# Patient Record
Sex: Female | Born: 1975
Health system: Southern US, Community
[De-identification: ages and names within clinical notes are randomized; demographics above are authoritative.]

## PROBLEM LIST (undated history)

## (undated) DIAGNOSIS — D696 Thrombocytopenia, unspecified: Secondary | ICD-10-CM

## (undated) DIAGNOSIS — R945 Abnormal results of liver function studies: Secondary | ICD-10-CM

## (undated) DIAGNOSIS — R7989 Other specified abnormal findings of blood chemistry: Secondary | ICD-10-CM

## (undated) DIAGNOSIS — F411 Generalized anxiety disorder: Secondary | ICD-10-CM

## (undated) HISTORY — DX: Generalized anxiety disorder: F41.1

## (undated) HISTORY — DX: Other specified abnormal findings of blood chemistry: R79.89

## (undated) HISTORY — DX: Abnormal results of liver function studies: R94.5

## (undated) HISTORY — DX: Thrombocytopenia, unspecified: D69.6

---

## 2003-02-14 ENCOUNTER — Other Ambulatory Visit: Admission: RE | Admit: 2003-02-14 | Discharge: 2003-02-14 | Payer: Self-pay | Admitting: Obstetrics & Gynecology

## 2004-06-13 ENCOUNTER — Ambulatory Visit (HOSPITAL_COMMUNITY): Admission: RE | Admit: 2004-06-13 | Discharge: 2004-06-13 | Payer: Self-pay | Admitting: Obstetrics & Gynecology

## 2006-07-28 ENCOUNTER — Encounter: Payer: Self-pay | Admitting: *Deleted

## 2006-07-28 ENCOUNTER — Inpatient Hospital Stay (HOSPITAL_COMMUNITY): Admission: AD | Admit: 2006-07-28 | Discharge: 2006-07-28 | Payer: Self-pay | Admitting: Family Medicine

## 2006-09-10 ENCOUNTER — Inpatient Hospital Stay (HOSPITAL_COMMUNITY): Admission: AD | Admit: 2006-09-10 | Discharge: 2006-09-10 | Payer: Self-pay | Admitting: *Deleted

## 2006-10-04 ENCOUNTER — Inpatient Hospital Stay (HOSPITAL_COMMUNITY): Admission: AD | Admit: 2006-10-04 | Discharge: 2006-10-09 | Payer: Self-pay | Admitting: Obstetrics and Gynecology

## 2006-10-06 ENCOUNTER — Encounter (INDEPENDENT_AMBULATORY_CARE_PROVIDER_SITE_OTHER): Payer: Self-pay | Admitting: Specialist

## 2009-12-16 ENCOUNTER — Emergency Department (HOSPITAL_COMMUNITY): Admission: EM | Admit: 2009-12-16 | Discharge: 2009-12-16 | Payer: Self-pay | Admitting: Emergency Medicine

## 2010-12-26 NOTE — Op Note (Signed)
NAME:  Lauren Olsen, TURNER         ACCOUNT NO.:  [id number]   MEDICAL RECORD NO.:  [id number]          PATIENT TYPE:  INP   LOCATION:  9165                          [facility]ACILITY:  [facility]   PHYSICIAN:  Creola B. Earlene Plater, M.D.  DATE OF BIRTH:  12-Jun-1976   DATE OF PROCEDURE:  10/06/2006  DATE OF DISCHARGE:                               OPERATIVE REPORT   PREOPERATIVE DIAGNOSES:  1. A 36-week intrauterine pregnancy.  2. Gestational hypertension.  3. Nonreassuring fetal heart rate tracing.   POSTOPERATIVE DIAGNOSES:  1. A 36-week intrauterine pregnancy.  2. Gestational hypertension.  3. Nonreassuring fetal heart rate tracing.   PROCEDURE:  Primary low transverse cesarean section.   SURGEON:  Chester Holstein. Earlene Plater, M.D.   ASSISTANT:  Marlinda Mike, C.N.M.   ANESTHESIA:  Epidural.   SPECIMENS:  Placenta to pathology.   BLOOD LOSS:  800 mL.   COMPLICATIONS:  None.   FINDINGS:  A viable female, cord around the neck x1, Apgars 4 and 6, pH  7.09, weight 5 pounds 15.6 ounces.   HISTORY OF PRESENT ILLNESS:  A 35 year old white female gravida 1, para  0 at 36+ weeks.  She had been admitted for induction of labor.  She had  been followed in the office with mildly elevated blood pressures at the  140s/90s range.  However, on the day of admission, she was noted to have  multiple blood pressures in the office in the 160s/100s to 110 range  with trace protein on dipstick.  A 24-hour urine was being dropped off  at that time as were PIH labs.  She also had complaints of mild  headache, no abdominal pain or visual changes.  Given the sudden  increase in blood pressure with associated headache this close to term,  I recommended to proceed with induction of labor.  She was advised of  the small risk of prematurity which should not be severe given the late  gestational age.  Benefit of induction would be given the sudden  increase in blood pressure, she would be at increased risk of maternal  or fetal  complications such as stroke or abruption.   Patient was induced with Pitocin and progressed to complete and was  pushing when she developed repetitive severe variable decelerations into  the 60-70 range for the fetal heart rate.  It was recovering but in a  prolonged manner.  Examination under epidural in the delivery room  showed the cervix to be completely dilated, +1 to +2 station for the  vertex, however, the position was ambiguous.  It was felt to be  asynclitic and occiput posterior, however, the exact position could not  be determined due to the amount of caput.  Given this, I recommended  stat C. section.   Patient was taken to the operating room and when she arrived, heart rate  was in the 160s,  Prepped and draped in standard fashion.  Foley  catheter was in the bladder.   PROCEDURE:  After prep and drape, Pfannenstiel incision made, carried to  the fascia.  The fascia was divided sharply and elevated with Kocher  clamps.  The inferior  rectus muscles dissected off sharply.  Posterior  sheath and peritoneum elevated and entered sharply.  Bladder blade  inserted.  Bladder flap created sharply.   Uterine incision made in low transverse fashion with the knife.  Clear  fluid at amniotomy, continued laterally and bluntly.  Vertex was found  to be OP, asynclitic with the brow presenting mostly.  An assistants  hand from below was necessary to facility elevation of the vertex.  After this, the mushroom cup vacuum was placed on fetal vertex.  From  the high green zone two pulls were necessary, one pop-off encountered.  Nose and mouth suctioned with the bulb without difficulty.  Cord around  the neck x1 was noted.  Cord was clamped and cut.  Infant was handed off  to the awaiting pediatricians.  Ancef 1 g was given at cord clamp.  Cord  pH obtained.   Placenta removed manually.  Uterus left in situ, cleared of all clots  and debris.  Slight uterine extension on the right hand side  which was  fully visualized and found to be well away from the broad ligament or  bladder.  Uterine incision closed in a running locked fashion with 0  chromic, second imbricating layer placed with hemostasis obtained.   Pelvis irrigated.  Uterine incision and bladder flap were hemostatic.   Fascia was closed with a running stitch of 0 Vicryl.  Subcutaneous  tissue was irrigated and made hemostatic with the Bovie and  reapproximated with a stitch of 2-0 plain suture.  Skin was closed with  staples.   The patient tolerated the procedure well.  There were no complications.  She was taken to the recovery room awake, alert and in stable condition.  Her baby boy was taken to the NICU for observation given mild increased  work of breathing.  Findings of the cord around the neck discussed with  the patient just after surgery as well as the pH and Apgar results.      [name] B. Earlene Plater, M.D.  Electronically Signed     WBD/MEDQ  D:  10/06/2006  T:  10/06/2006  Job:  [id number]

## 2010-12-26 NOTE — Discharge Summary (Signed)
NAME:  KARRY, BARRILLEAUX         ACCOUNT NO.:  [id number]   MEDICAL RECORD NO.:  [id number]          PATIENT TYPE:  INP   LOCATION:  9304                          [facility]:  [facility]   PHYSICIAN:  [place] B. Earlene Plater, M.D.  DATE OF BIRTH:  [birth year]   DATE OF ADMISSION:  10/04/2006  DATE OF DISCHARGE:                               DISCHARGE SUMMARY   ADMISSION DIAGNOSES:  1. A 36-week intrauterine pregnancy.  2. Hypertension (gestational versus PIH).   DISCHARGE DIAGNOSES:  1. A 36-week intrauterine pregnancy.  2. Hypertension (gestational versus PIH).   PROCEDURES THIS ADMISSION:  1. Induction of labor.  2. Primary low transverse Cesarean section for nonreassuring fetal      heart tracing.   HISTORY OF PRESENT ILLNESS:  A 35 year old white female, gravida 1, para  1, admitted from the office at 36+ weeks with sudden increased blood  pressure. Had been followed as an outpatient with blood pressures in the  140s/90s range with normal 24-hour urine. On the day of admission in the  office, she was found to be in the 160s/100 to 110 range on several  blood pressure checks. Also had trace protein in the urine and  complaints of a headache. Given this abrupt change in blood pressure  with associated headache, I recommended induction of labor.   HOSPITAL COURSE:  The patient was admitted, cervix ripened, and  subsequently Pitocin started. Interestingly, her blood pressures  remained in the mildly elevated range and never again seen as high as in  the office. She was stabilized in the 140s/80s max with normal  preeclampsia labs. Subsequent to her induction being initiated, she was  found to have a normal 24-hour urine from the office lab report.  However, given the previous blood pressure readings and symptoms, I  recommend we proceed with the induction.   The patient was ultimately complete and pushing when she was found to  have repetitive, severe deceleration associated fetal  brachycardia into  the 70s. Given this, we proceeded with a stat cesarean section. In the  OR, the heart rate did recover to the 160s. She was delivered by primary  low transverse Cesarean section a viable female. Nuchal cord x1 noted.  Apgars were 4 and 6, pH was 7.09, weight was 5 pounds 16.5 ounces. Given  her normal urine results on 24-hour urine and blood pressure, she was  not placed on magnesium post partum.   The patient's blood pressures mostly remained in the 140s/80s with an  occasional 160/100 postoperatively. Her platelets did drop into the 90s  on the first postpartum day. One dose of Solu-Medrol 120 mg IV was given  for this. Her platelets oscillated between the 90 and 100 range  throughout the rest of her stay without any further decrease. The  remainder of her labs were normal.   The patient was discharged to home on the third postoperative day in  satisfactory condition. Her newborn boy was in the NICU for mild  increased work of breathing but stable and doing well with anticipation  for discharge tomorrow.   DISPOSITION AT DISCHARGE:  Satisfactory. Follow up in 2  days [facility], staple removal and blood pressure check and recheck of platelet  count.   DISCHARGE INSTRUCTIONS:  Standard preprinted instructions given prior to  dismissal as well as preeclamptic precautions.   DISCHARGE MEDICATIONS:  1. Percocet 1 to 2 p.o. q.4-6h. p.r.n. for pain.  2. Ibuprofen 800 mg every 8 hours p.r.n. for pain.      [name] B. Earlene Plater, M.D.  Electronically Signed     WBD/MEDQ  D:  10/09/2006  T:  10/09/2006  Job:  [id number]

## 2010-12-26 NOTE — Op Note (Signed)
Lauren Olsen, Lauren Olsen            ACCOUNT NO.:  [id number]   MEDICAL RECORD NO.:  [id number]          PATIENT TYPE:  AMB   LOCATION:  SDC                           [facility]   PHYSICIAN:  Genia Del, M.D.DATE OF BIRTH:  1976/02/27   DATE OF PROCEDURE:  DATE OF DISCHARGE:                                 OPERATIVE REPORT   PREOPERATIVE DIAGNOSIS:  CIN 2 with large ectropion.   POSTOPERATIVE DIAGNOSIS:  CIN 2 with large ectropion.   INTERVENTION:  Colposcopy with acetic acid and CO2 laser vaporization of  cervix.   PROCEDURE:  Under general anesthesia with endotracheal intubation and the  patient in the lithotomy position, wet drapes are put in place.  The  carbonated speculum is inserted and colposcopy is done after applying acetic  acid on the cervix.  The ectropion is large in the area of punctation and  acetowhite reaction is present especially around 9 o'clock on the external  aspect of the ectropion; otherwise, mild acetowhite changes are present all  around the cervix but more mildly.  We then used the CO2 laser with a power  of 10 and vaporized the cervix starting at 9 o'clock to 6 o'clock.  At the  area of CIN 2 we got a little bit more deeply to about 5 mm in depth.  The  rest of the cervix is vaporized to a depth less than 3 mm.  The whole  ectropion with a small margin is vaporized.  Bleeding is controlled with the  laser and the completed with Monsel.  Hemostasis is adequate.  Estimated  blood loss was less than 50 mL.  No complications occurred and the patient  was brought to recovery room in good status.  The patient will be seen 3  weeks postoperatively and a repeat Pap test will be done in 3-4 months.      ML/MEDQ  D:  06/13/2004  T:  06/14/2004  Job:  [id number]

## 2014-01-02 ENCOUNTER — Telehealth: Payer: Self-pay | Admitting: Hematology and Oncology

## 2014-01-02 ENCOUNTER — Other Ambulatory Visit: Payer: Self-pay | Admitting: Family Medicine

## 2014-01-02 DIAGNOSIS — R229 Localized swelling, mass and lump, unspecified: Principal | ICD-10-CM

## 2014-01-02 DIAGNOSIS — IMO0002 Reserved for concepts with insufficient information to code with codable children: Secondary | ICD-10-CM

## 2014-01-02 NOTE — Telephone Encounter (Signed)
C/D 01/02/14 for appt. 01/17/14

## 2014-01-02 NOTE — Telephone Encounter (Signed)
S/W PATIENT AND GAVE NP APPT FOR 06/10 @ 10:45 W/DR. Carleton, CNM DX- LOW PLTS  WELCOME PACKET MAILED.

## 2014-01-03 ENCOUNTER — Ambulatory Visit
Admission: RE | Admit: 2014-01-03 | Discharge: 2014-01-03 | Disposition: A | Discharge status: Home / Self Care | Payer: 59 | Source: Ambulatory Visit | Attending: Family Medicine | Admitting: Family Medicine

## 2014-01-03 ENCOUNTER — Other Ambulatory Visit: Payer: Self-pay | Admitting: Family Medicine

## 2014-01-03 DIAGNOSIS — IMO0002 Reserved for concepts with insufficient information to code with codable children: Secondary | ICD-10-CM

## 2014-01-03 DIAGNOSIS — M79622 Pain in left upper arm: Secondary | ICD-10-CM

## 2014-01-03 DIAGNOSIS — R229 Localized swelling, mass and lump, unspecified: Principal | ICD-10-CM

## 2014-01-04 ENCOUNTER — Ambulatory Visit
Admission: RE | Admit: 2014-01-04 | Discharge: 2014-01-04 | Disposition: A | Discharge status: Home / Self Care | Payer: 59 | Source: Ambulatory Visit | Attending: Family Medicine | Admitting: Family Medicine

## 2014-01-04 DIAGNOSIS — M79622 Pain in left upper arm: Secondary | ICD-10-CM

## 2014-01-10 ENCOUNTER — Telehealth: Payer: Self-pay | Admitting: Hematology and Oncology

## 2014-01-10 NOTE — Telephone Encounter (Signed)
pt lmonvm to cx 6/10 appt and she does not wish to r/s. new pt - message to NG and tiffany.

## 2014-01-17 ENCOUNTER — Ambulatory Visit: Payer: Self-pay

## 2014-01-17 ENCOUNTER — Ambulatory Visit: Payer: Self-pay | Admitting: Hematology and Oncology

## 2016-05-06 LAB — HM PAP SMEAR

## 2017-05-17 ENCOUNTER — Telehealth: Payer: Self-pay | Admitting: Oncology

## 2017-05-17 ENCOUNTER — Encounter: Payer: Self-pay | Admitting: Oncology

## 2017-05-17 NOTE — Telephone Encounter (Signed)
Appt has been scheduled for the pt to see Dr. Alen Blew on 10/10 at 2pm. Pt aware to arrive 30 minutes early. Address and insurance updated. Letter faxed to the referring.

## 2017-05-19 ENCOUNTER — Ambulatory Visit (HOSPITAL_BASED_OUTPATIENT_CLINIC_OR_DEPARTMENT_OTHER): Payer: 59 | Admitting: Oncology

## 2017-05-19 ENCOUNTER — Ambulatory Visit (HOSPITAL_BASED_OUTPATIENT_CLINIC_OR_DEPARTMENT_OTHER): Payer: 59

## 2017-05-19 ENCOUNTER — Telehealth: Payer: Self-pay | Admitting: Oncology

## 2017-05-19 VITALS — BP 160/53 | HR 91 | Temp 98.6°F | Resp 18 | Ht 69.0 in | Wt 299.6 lb

## 2017-05-19 DIAGNOSIS — D696 Thrombocytopenia, unspecified: Secondary | ICD-10-CM | POA: Diagnosis not present

## 2017-05-19 LAB — COMPREHENSIVE METABOLIC PANEL
ALK PHOS: 77 U/L (ref 40–150)
ALT: 46 U/L (ref 0–55)
ANION GAP: 8 meq/L (ref 3–11)
AST: 74 U/L — ABNORMAL HIGH (ref 5–34)
Albumin: 3.7 g/dL (ref 3.5–5.0)
BILIRUBIN TOTAL: 1.02 mg/dL (ref 0.20–1.20)
BUN: 13 mg/dL (ref 7.0–26.0)
CO2: 23 mEq/L (ref 22–29)
Calcium: 9.3 mg/dL (ref 8.4–10.4)
Chloride: 108 mEq/L (ref 98–109)
Creatinine: 0.7 mg/dL (ref 0.6–1.1)
GLUCOSE: 89 mg/dL (ref 70–140)
Potassium: 3.9 mEq/L (ref 3.5–5.1)
Sodium: 138 mEq/L (ref 136–145)
Total Protein: 7.7 g/dL (ref 6.4–8.3)

## 2017-05-19 LAB — CBC WITH DIFFERENTIAL/PLATELET
BASO%: 0.3 % (ref 0.0–2.0)
Basophils Absolute: 0 10*3/uL (ref 0.0–0.1)
EOS ABS: 0.1 10*3/uL (ref 0.0–0.5)
EOS%: 1.7 % (ref 0.0–7.0)
HEMATOCRIT: 41.3 % (ref 34.8–46.6)
HGB: 14.2 g/dL (ref 11.6–15.9)
LYMPH#: 0.5 10*3/uL — AB (ref 0.9–3.3)
LYMPH%: 15 % (ref 14.0–49.7)
MCH: 30.7 pg (ref 25.1–34.0)
MCHC: 34.4 g/dL (ref 31.5–36.0)
MCV: 89.2 fL (ref 79.5–101.0)
MONO#: 0.3 10*3/uL (ref 0.1–0.9)
MONO%: 9.1 % (ref 0.0–14.0)
NEUT%: 73.9 % (ref 38.4–76.8)
NEUTROS ABS: 2.7 10*3/uL (ref 1.5–6.5)
Platelets: 54 10*3/uL — ABNORMAL LOW (ref 145–400)
RBC: 4.63 10*6/uL (ref 3.70–5.45)
RDW: 14 % (ref 11.2–14.5)
WBC: 3.6 10*3/uL — AB (ref 3.9–10.3)

## 2017-05-19 LAB — CHCC SMEAR

## 2017-05-19 NOTE — Progress Notes (Signed)
Reason for Referral: Thrombocytopenia.   HPI: 41 year old woman native of [place] currently lives in this area. She is a healthy woman without any significant comorbid conditions. She was noted to have thrombocytopenia on a CBC done on 05/11/2017. She had the blood work done as a preoperative assessment before tubal ligation. She noted that to have a thrombocytopenia periodically for the last 10 years after her pregnancy. Her pregnancy was uncomplicated except for preeclampsia and was induced and gave birth without complications. She denied any postoperative bleeding. She did have menorrhagia and was started on oral contraceptives and for last 2 years has had no leading issues. She denies any hematochezia, melena, epistaxis or ecchymosis. She denied any petechiae. Her CBC on 05/11/2017 showed a white count of 3.0, hemoglobin of 14 1 and MCV of 86. Her RDW was 14. Platelet count count of 54. Her creatinine was 0.57, with AST of 53 which was slightly elevated. Her liver function tests otherwise normal. Her total protein, calcium and electrolytes were all normal.  She denied any headaches, blurry vision, syncope or seizures. She does not report any fevers, chills, sweats or weight loss. She does not report any chest pain, palpitation, orthopnea or leg edema. She does not report any cough, wheezing or hemoptysis. She does not report any nausea, vomiting or abdominal pain. She does not report any frequency urgency or hesitancy. She does not report any skeletal complaints. Remaining review of systems unremarkable.   No past medical history for hypertension, hyperlipidemia or coronary disease.    Current Outpatient Prescriptions:  .  CAMILA 0.35 MG tablet, Take 1 tablet by mouth daily., Disp: , Rfl:  .  FLUoxetine (PROZAC) 40 MG capsule, Take 40 mg by mouth daily., Disp: , Rfl: :  No Known Allergies:  No family history significant for blood disorders.   Social History   Social History  . Marital  status: Single    Spouse name: N/A  . Number of children: N/A  . Years of education: N/A   Occupational History  . Not on file.   Social History Main Topics  . Smoking status: Not on file  . Smokeless tobacco: Not on file  . Alcohol use Not on file  . Drug use: Unknown  . Sexual activity: Not on file  She denied any alcohol or tobacco abuse. Other Topics Concern  . Not on file   Social History Narrative  . No narrative on file  :  Pertinent items are noted in HPI.  Exam: Blood pressure (!) 160/53, pulse 91, temperature 98.6 F (37 C), temperature source Oral, resp. rate 18, height 5' 9"  (1.753 m), weight 299 lb 9.6 oz (135.9 kg), SpO2 96 %. General appearance: alert and cooperative appeared without distress. Throat: No oral thrush or ulcers. Neck: no adenopathy or masses. Back: negative Resp: clear to auscultation bilaterally Chest wall: no tenderness Cardio: regular rate and rhythm, S1, S2 normal, no murmur, click, rub or gallop GI: soft, non-tender; bowel sounds normal; no masses,  no splenomegaly. Pulses: 2+ and symmetric Skin: Skin color, texture, turgor normal. No rashes or lesions. No petechiae or ecchymosis. Lymph nodes: Cervical, supraclavicular, and axillary nodes normal.    Assessment and Plan:    41 year old woman with the following issues:  1. Thrombocytopenia: Documented on 05/11/2017 with a platelet count of 54,000. His platelet counts have been abnormal in the last 10 years per her report. She denied any bleeding episodes but does report occasional easy bruising but no ecchymosis. She did have  menorrhagia 2 years ago but have improved with oral contraceptives.  The differential diagnosis of thrombocytopenia was discussed today. Given her age and presentation this appears to be consistent with ITP. Other considerations would include medication related, autoimmune, liver disease among others.  Management standpoint, I will repeat her CBC and a peripheral  smear today for review. As long as her platelet counts remain above 50,000, continued observation may be warranted. She is not at risk of spontaneous bleeding at this time.  We discussed different treatment options if needed to to boost her platelet count. These include corticosteroids and IVIG. These options would be used if her platelet counts drop or anticipating surgical procedure.  2. Bleeding risk: She is scheduled to have a tubal ligation in the near future. Platelet count count of 54,000 should be adequate to have the procedure safely. I will repeat her platelet counts to ensure of that today.  3. Follow-up: Will be in 3 months sooner if needed to.

## 2017-05-19 NOTE — Telephone Encounter (Signed)
Scheduled appt per 10/10 los - Gave patient AVS and calender per los.

## 2017-08-18 ENCOUNTER — Telehealth: Payer: Self-pay | Admitting: Oncology

## 2017-08-18 ENCOUNTER — Other Ambulatory Visit: Payer: Self-pay | Admitting: Oncology

## 2017-08-18 DIAGNOSIS — D696 Thrombocytopenia, unspecified: Secondary | ICD-10-CM

## 2017-08-18 NOTE — Telephone Encounter (Signed)
Patient called to cancel did not want to reschedule at this time

## 2017-08-19 ENCOUNTER — Other Ambulatory Visit: Payer: 59

## 2017-08-19 ENCOUNTER — Ambulatory Visit: Payer: 59 | Admitting: Oncology

## 2018-05-05 NOTE — Progress Notes (Signed)
Lauren Olsen is a 42 y.o. female is here to [employer].   Patient Care Team: Lauren Deutscher, DO as PCP - General (Family Medicine) Lauren Olsen, CNM as Midwife (Obstetrics and Gynecology)   History of Present Illness:   Lauren Olsen, CMA acting as scribe for Dr. Briscoe Olsen.   HPI: Patient in office to establish care. Was referred here by Dr. Buelah Olsen. She has history of low blood counts she would like to have checked. She is also having increased bilateral knee pain.   Within the past 3 months... Rarely Sometimes Often Always  During the past 3 months, have you had any episodes of excessive overeating?  x    Do you feel distressed about your excessive overeating?   x   During your episodes of excessive overeating, how often did you feel like you had no control over your eating?   x   During your episodes of excessive eating, how often did you continue to eat even though you were not hungry?   x   During your episodes of excessive overeating, how often were you embarrassed by how much you ate?   x   During your episodes of excessive overeating, how often did you feel disgusted by yourself or guilty afterwards?    x  During the last 3 months, how often did you make yourself vomit as a means to control your weight or shape? NEVER       Review of Systems  Constitutional: Positive for malaise/fatigue. Negative for chills, fever and weight loss.       Weight gain.  Respiratory: Negative for cough, shortness of breath and wheezing.        Snoring.  Cardiovascular: Negative for chest pain, palpitations and leg swelling.  Gastrointestinal: Negative for abdominal pain, constipation, diarrhea, nausea and vomiting.  Genitourinary: Negative for dysuria and urgency.  Musculoskeletal: Positive for joint pain. Negative for myalgias.  Skin: Negative for rash.  Neurological: Negative for dizziness and headaches.  Psychiatric/Behavioral: Negative for depression, substance abuse  and suicidal ideas. The patient is not nervous/anxious.     Health Maintenance Due  Topic Date Due  . TETANUS/TDAP  09/03/1992  . PAP SMEAR  09/03/1994   Depression screen PHQ 2/9 05/06/2018  Decreased Interest 1  Down, Depressed, Hopeless 1  PHQ - 2 Score 2  Altered sleeping 2  Tired, decreased energy 3  Change in appetite 2  Feeling bad or failure about yourself  1  Trouble concentrating 1  Moving slowly or fidgety/restless 1  Suicidal thoughts 0  PHQ-9 Score 12  Difficult doing work/chores Somewhat difficult   PMHx, SurgHx, SocialHx, Medications, and Allergies were reviewed in the Visit Navigator and updated as appropriate.   Past Medical History:  Diagnosis Date  . Elevated LFTs   . GAD (generalized anxiety disorder)   . Thrombocytopenia ([place])     History reviewed. No pertinent surgical history.   Family History  Problem Relation Age of Onset  . Hearing loss Mother   . Heart disease Mother   . Miscarriages / [place] Mother   . Alcohol abuse Father   . Arthritis Maternal Grandmother   . Heart attack Maternal Grandmother   . Heart disease Maternal Grandmother   . Cancer Paternal Grandfather     Social History   Tobacco Use  . Smoking status: Never Smoker  . Smokeless tobacco: Never Used  Substance Use Topics  . Alcohol use: Yes    Comment: occ   .  Drug use: Never    Current Medications and Allergies:   .  CAMILA 0.35 MG tablet, Take 1 tablet by mouth daily., Disp: , Rfl:  .  FLUoxetine (PROZAC) 40 MG capsule, Take 40 mg by mouth daily., Disp: , Rfl:   No Known Allergies   Review of Systems:   Pertinent items are noted in the HPI. Otherwise, ROS is negative.  Vitals:   Vitals:   05/06/18 1036  BP: 140/78  Pulse: 84  Temp: 99.2 F (37.3 C)  TempSrc: Oral  SpO2: 96%  Weight: (!) 307 lb 6.4 oz (139.4 kg)  Height: 5' 9"  (1.753 m)     Body mass index is 45.4 kg/m.  Physical Exam:   Physical Exam  Constitutional: She appears  well-nourished.  HENT:  Head: Normocephalic and atraumatic.  Eyes: Pupils are equal, round, and reactive to light. EOM are normal.  Neck: Normal range of motion. Neck supple.  Cardiovascular: Normal rate, regular rhythm, normal heart sounds and intact distal pulses.  Pulmonary/Chest: Effort normal.  Abdominal: Soft.  Skin: Skin is warm.  Psychiatric: She has a normal mood and affect. Her behavior is normal.  Nursing note and vitals reviewed.  Assessment and Plan:   Lauren was seen today for establish care.  Diagnoses and all orders for this visit:  Weight gain Comments: Hx of weight loss with phentermine of 75 pounds but gained back. Orders: -     phentermine (ADIPEX-P) 37.5 MG tablet; Take 1 tablet (37.5 mg total) by mouth daily before breakfast.  Encounter for screening for HIV -     HIV Antibody (routine testing w rflx)  Malaise and fatigue -     CBC with Differential/Platelet -     Comprehensive metabolic panel -     Lipid panel -     Vitamin B12 -     TSH  Vitamin D deficiency -     VITAMIN D 25 Hydroxy (Vit-D Deficiency, Fractures)  Sleep-disordered breathing -     Ambulatory referral to Sleep Studies  GAD (generalized anxiety disorder) Comments: Stable on Prozac for 10 years. Okay with changing if needed.  Thrombocytopenia ([place]) Comments: Hx of evaluation by Hematology. Okay to monitor as long as platelets > 50.  Elevated LFTs  Acute pain of both knees Comments: x a few months. Will work on weight loss.  Family history of early CAD Comments: Mom with bypass by age 73, nonsmoker.   . Reviewed expectations re: course of current medical issues. . Discussed self-management of symptoms. . Outlined signs and symptoms indicating need for more acute intervention. . Patient verbalized understanding and all questions were answered. Marland Kitchen Health Maintenance issues including appropriate healthy diet, exercise, and smoking avoidance were discussed with  patient. . See orders for this visit as documented in the electronic medical record. . Patient received an After Visit Summary.  CMA served as Education administrator during this visit. History, Physical, and Plan performed by medical provider. The above documentation has been reviewed and is accurate and complete. Lauren Olsen, D.O.  Lauren Deutscher, DO [place] 05/08/2018

## 2018-05-06 ENCOUNTER — Encounter: Payer: Self-pay | Admitting: Family Medicine

## 2018-05-06 ENCOUNTER — Ambulatory Visit (INDEPENDENT_AMBULATORY_CARE_PROVIDER_SITE_OTHER): Payer: 59 | Admitting: Family Medicine

## 2018-05-06 VITALS — BP 140/78 | HR 84 | Temp 99.2°F | Ht 69.0 in | Wt 307.4 lb

## 2018-05-06 DIAGNOSIS — M25561 Pain in right knee: Secondary | ICD-10-CM

## 2018-05-06 DIAGNOSIS — R7989 Other specified abnormal findings of blood chemistry: Secondary | ICD-10-CM | POA: Insufficient documentation

## 2018-05-06 DIAGNOSIS — R5383 Other fatigue: Secondary | ICD-10-CM | POA: Diagnosis not present

## 2018-05-06 DIAGNOSIS — R5381 Other malaise: Secondary | ICD-10-CM | POA: Diagnosis not present

## 2018-05-06 DIAGNOSIS — Z114 Encounter for screening for human immunodeficiency virus [HIV]: Secondary | ICD-10-CM | POA: Diagnosis not present

## 2018-05-06 DIAGNOSIS — R945 Abnormal results of liver function studies: Secondary | ICD-10-CM

## 2018-05-06 DIAGNOSIS — R635 Abnormal weight gain: Secondary | ICD-10-CM | POA: Diagnosis not present

## 2018-05-06 DIAGNOSIS — D696 Thrombocytopenia, unspecified: Secondary | ICD-10-CM

## 2018-05-06 DIAGNOSIS — E559 Vitamin D deficiency, unspecified: Secondary | ICD-10-CM | POA: Diagnosis not present

## 2018-05-06 DIAGNOSIS — M25562 Pain in left knee: Secondary | ICD-10-CM

## 2018-05-06 DIAGNOSIS — Z8249 Family history of ischemic heart disease and other diseases of the circulatory system: Secondary | ICD-10-CM

## 2018-05-06 DIAGNOSIS — G4730 Sleep apnea, unspecified: Secondary | ICD-10-CM

## 2018-05-06 DIAGNOSIS — F411 Generalized anxiety disorder: Secondary | ICD-10-CM

## 2018-05-06 LAB — LIPID PANEL
Cholesterol: 140 mg/dL (ref 0–200)
HDL: 43.5 mg/dL (ref >39.00)
LDL Cholesterol: 78 mg/dL (ref 0–99)
NonHDL: 96.07
Total CHOL/HDL Ratio: 3
Triglycerides: 88 mg/dL (ref 0.0–149.0)
VLDL: 17.6 mg/dL (ref 0.0–40.0)

## 2018-05-06 LAB — CBC WITH DIFFERENTIAL/PLATELET
Basophils Absolute: 0 10*3/uL (ref 0.0–0.1)
Basophils Relative: 0.6 % (ref 0.0–3.0)
Eosinophils Absolute: 0.1 10*3/uL (ref 0.0–0.7)
Eosinophils Relative: 2.4 % (ref 0.0–5.0)
HCT: 40.7 % (ref 36.0–46.0)
Hemoglobin: 13.9 g/dL (ref 12.0–15.0)
Lymphocytes Relative: 29.4 % (ref 12.0–46.0)
Lymphs Abs: 0.7 10*3/uL (ref 0.7–4.0)
MCHC: 34.1 g/dL (ref 30.0–36.0)
MCV: 89.3 fl (ref 78.0–100.0)
Monocytes Absolute: 0.2 10*3/uL (ref 0.1–1.0)
Monocytes Relative: 10.9 % (ref 3.0–12.0)
Neutro Abs: 1.3 10*3/uL — ABNORMAL LOW (ref 1.4–7.7)
Neutrophils Relative %: 56.7 % (ref 43.0–77.0)
Platelets: 53 10*3/uL — ABNORMAL LOW (ref 150.0–400.0)
RBC: 4.56 Mil/uL (ref 3.87–5.11)
RDW: 13.7 % (ref 11.5–15.5)
WBC: 2.3 10*3/uL — ABNORMAL LOW (ref 4.0–10.5)

## 2018-05-06 LAB — COMPREHENSIVE METABOLIC PANEL
ALT: 32 U/L (ref 0–35)
AST: 52 U/L — ABNORMAL HIGH (ref 0–37)
Albumin: 3.6 g/dL (ref 3.5–5.2)
Alkaline Phosphatase: 65 U/L (ref 39–117)
BUN: 11 mg/dL (ref 6–23)
CO2: 29 mEq/L (ref 19–32)
Calcium: 8.8 mg/dL (ref 8.4–10.5)
Chloride: 107 mEq/L (ref 96–112)
Creatinine, Ser: 0.63 mg/dL (ref 0.40–1.20)
GFR: 108.77 mL/min (ref >60.00)
Glucose, Bld: 100 mg/dL — ABNORMAL HIGH (ref 70–99)
Potassium: 3.9 mEq/L (ref 3.5–5.1)
Sodium: 140 mEq/L (ref 135–145)
Total Bilirubin: 1.1 mg/dL (ref 0.2–1.2)
Total Protein: 7 g/dL (ref 6.0–8.3)

## 2018-05-06 LAB — VITAMIN B12: Vitamin B-12: 995 pg/mL — ABNORMAL HIGH (ref 211–911)

## 2018-05-06 LAB — TSH: TSH: 1.12 u[IU]/mL (ref 0.35–4.50)

## 2018-05-06 LAB — VITAMIN D 25 HYDROXY (VIT D DEFICIENCY, FRACTURES): VITD: 26.44 ng/mL — ABNORMAL LOW (ref 30.00–100.00)

## 2018-05-06 MED ORDER — PHENTERMINE HCL 37.5 MG PO TABS
37.5000 mg | ORAL_TABLET | Freq: Every day | ORAL | 0 refills | Status: DC
Start: 2018-05-06 — End: 2018-06-21

## 2018-05-07 LAB — HIV ANTIBODY (ROUTINE TESTING W REFLEX): HIV 1&2 Ab, 4th Generation: NONREACTIVE

## 2018-05-08 ENCOUNTER — Encounter: Payer: Self-pay | Admitting: Family Medicine

## 2018-06-08 ENCOUNTER — Ambulatory Visit: Payer: 59 | Admitting: Family Medicine

## 2018-06-17 ENCOUNTER — Ambulatory Visit: Payer: 59 | Admitting: Family Medicine

## 2018-06-19 NOTE — Progress Notes (Signed)
Lauren Olsen is a 42 y.o. female is here for follow up.  History of Present Illness:   Lauren Olsen, CMA acting as scribe for Dr. Briscoe Deutscher.   HPI: See Assessment and Plan section for Problem Based Charting of issues discussed today.  Follow up on medication. She is currently taking Phentermine 37.73m daily she is down 23lbs from last visit. She has been working on portion control and will start a new exercise regiment soon.   Health Maintenance Due  Topic Date Due  . TETANUS/TDAP  09/03/1992  . PAP SMEAR  09/03/1994   [facility] 05/06/2018  Decreased Interest 0 1  Down, Depressed, Hopeless 0 1  PHQ - 2 Score 0 2  Altered sleeping 0 2  Tired, decreased energy 0 3  Change in appetite 0 2  Feeling bad or failure about yourself  0 1  Trouble concentrating 0 1  Moving slowly or fidgety/restless 0 1  Suicidal thoughts 0 0  PHQ-9 Score 0 12  Difficult doing work/chores Not difficult at all Somewhat difficult   PMHx, SurgHx, SocialHx, FamHx, Medications, and Allergies were reviewed in the Visit Navigator and updated as appropriate.   Patient Active Problem List   Diagnosis Date Noted  . Morbid obesity ([facility] 06/21/2018  . Vitamin D deficiency 06/21/2018  . GAD (generalized anxiety disorder)   . Thrombocytopenia ([facility]   . Elevated LFTs    Social History   Tobacco Use  . Smoking status: Never Smoker  . Smokeless tobacco: Never Used  Substance Use Topics  . Alcohol use: Yes    Comment: occ   . Drug use: Never   Current Medications and Allergies:   .  CAMILA 0.35 MG tablet, Take 1 tablet by mouth daily., Disp: , Rfl:  .  FLUoxetine (PROZAC) 40 MG capsule, Take 40 mg by mouth daily., Disp: , Rfl:  .  phentermine (ADIPEX-P) 37.5 MG tablet, Take 1 tablet (37.5 mg total) by mouth daily before breakfast., Disp: 30 tablet, Rfl: 0  No Known Allergies   Review of Systems   Pertinent items are noted in the HPI. Otherwise, ROS is  negative.  Vitals:   Vitals:   06/21/18 0744  BP: (!) 150/80  Pulse: 80  Temp: 98.9 F (37.2 C)  TempSrc: Oral  SpO2: 95%  Weight: 284 lb 12.8 oz (129.2 kg)  Height: 5' 9"  (1.753 m)     Body mass index is 42.06 kg/m.  Physical Exam:   Physical Exam  Constitutional: She appears well-nourished.  HENT:  Head: Normocephalic and atraumatic.  Eyes: Pupils are equal, round, and reactive to light. EOM are normal.  Neck: Normal range of motion. Neck supple.  Cardiovascular: Normal rate, regular rhythm and intact distal pulses.  Murmur heard.  Systolic murmur is present with a grade of 2/6. Pulmonary/Chest: Effort normal.  Abdominal: Soft.  Skin: Skin is warm.  Psychiatric: She has a normal mood and affect. Her behavior is normal.  Nursing note and vitals reviewed.  Assessment and Plan:   Vitamin D deficiency Goal vitamin D level > 40. Plan: Recheck after supplementation.  Morbid obesity ([place] Wt Readings from Last 3 Encounters:  06/21/18 284 lb 12.8 oz (129.2 kg)  05/06/18 (!) 307 lb 6.4 oz (139.4 kg)  05/19/17 299 lb 9.6 oz (135.9 kg)   Patient is doing very well. Making healthy food choices. Not exercising yet. Plan: Incorporate exercise program. Continue Phentermine. Recheck at next visit.    Elevated LFTs  Lab Results  Component Value Date   ALT 32 05/06/2018   AST 52 (H) 05/06/2018   ALKPHOS 65 05/06/2018   BILITOT 1.1 05/06/2018   Will continue to monitor.  Orders Placed This Encounter  Procedures  . Tdap vaccine greater than or equal to 7yo IM  . VITAMIN D 25 Hydroxy (Vit-D Deficiency, Fractures)   Meds ordered this encounter  Medications  . phentermine (ADIPEX-P) 37.5 MG tablet    Sig: Take 1 tablet (37.5 mg total) by mouth daily before breakfast.    Dispense:  30 tablet    Refill:  2   . Reviewed expectations re: course of current medical issues. . Discussed self-management of symptoms. . Outlined signs and symptoms indicating need for more  acute intervention. . Patient verbalized understanding and all questions were answered. Marland Kitchen Health Maintenance issues including appropriate healthy diet, exercise, and smoking avoidance were discussed with patient. . See orders for this visit as documented in the electronic medical record. . Patient received an After Visit Summary.  Briscoe Deutscher, DO [place], [facility] 06/21/2018

## 2018-06-21 ENCOUNTER — Ambulatory Visit (INDEPENDENT_AMBULATORY_CARE_PROVIDER_SITE_OTHER): Payer: 59 | Admitting: Family Medicine

## 2018-06-21 ENCOUNTER — Encounter: Payer: Self-pay | Admitting: Family Medicine

## 2018-06-21 VITALS — BP 150/80 | HR 80 | Temp 98.9°F | Ht 69.0 in | Wt 284.8 lb

## 2018-06-21 DIAGNOSIS — Z1331 Encounter for screening for depression: Secondary | ICD-10-CM | POA: Diagnosis not present

## 2018-06-21 DIAGNOSIS — R7989 Other specified abnormal findings of blood chemistry: Secondary | ICD-10-CM

## 2018-06-21 DIAGNOSIS — Z23 Encounter for immunization: Secondary | ICD-10-CM

## 2018-06-21 DIAGNOSIS — E559 Vitamin D deficiency, unspecified: Secondary | ICD-10-CM

## 2018-06-21 DIAGNOSIS — R945 Abnormal results of liver function studies: Secondary | ICD-10-CM

## 2018-06-21 LAB — VITAMIN D 25 HYDROXY (VIT D DEFICIENCY, FRACTURES): VITD: 42.97 ng/mL (ref 30.00–100.00)

## 2018-06-21 MED ORDER — PHENTERMINE HCL 37.5 MG PO TABS: 37.5000 mg | ORAL_TABLET | Freq: Every day | ORAL | 2 refills | Status: DC

## 2018-06-21 NOTE — Assessment & Plan Note (Addendum)
Goal vitamin D level > 40. Plan: Recheck after supplementation.

## 2018-06-21 NOTE — Patient Instructions (Signed)
Marland Kitchen....Vaccine Information Statement   Tdap (Tetanus, Diphtheria, Pertussis) Vaccine: What You Need to Know  Many Vaccine Information Statements are available in Spanish and other languages. See AbsolutelyGenuine.com.br. Hojas de Informacin Sobre Vacunas estn disponibles en espaol y en muchos otros idiomas. Visite https://www.martin.org/  1. Why get vaccinated?  Tetanus, diphtheria, and pertussis are very serious diseases. Tdap vaccine can protect [place] from these diseases.  And, Tdap vaccine given to pregnant women can protect newborn babies against pertussis.  TETANUS (Lockjaw) is rare in the [place] today. It causes painful muscle tightening and stiffness, usually all over the body. . It can lead to tightening of muscles in the head and neck so you can't open your mouth, swallow, or sometimes even breathe. Tetanus kills about 1 out of 10 people who are infected even after receiving the best medical care.    DIPHTHERIA is also rare in the [place] today.  It can cause a thick coating to form in the back of the throat. . It can lead to breathing problems, heart failure, paralysis, and death.  PERTUSSIS (Whooping Cough) causes severe coughing spells, which can cause difficulty breathing, vomiting, and disturbed sleep. . It can also lead to weight loss, incontinence, and rib fractures. Up to 2 in 100 adolescents and 5 in 100 adults with pertussis are hospitalized or have complications, which could include pneumonia or death.   These diseases are caused by bacteria. Diphtheria and pertussis are spread from person to person through secretions from coughing or sneezing. Tetanus enters the body through cuts, scratches, or wounds.  Before vaccines, as many as 200,000 cases of diphtheria, 200,000 cases of pertussis, and hundreds of cases of tetanus, were reported in the [place] each year. Since vaccination began, reports of cases for tetanus and diphtheria have dropped by about 99% and  for pertussis by about 80%.  2. Tdap vaccine  Tdap vaccine can protect adolescents and adults from tetanus, diphtheria, and pertussis. One dose of Tdap is routinely given at age 44 or 44.  People who did not get Tdap at that age should get it as soon as possible.  Tdap is especially important for health care professionals and anyone having close contact with a baby younger than 12 months.    Pregnant women should get a dose of Tdap during every pregnancy, to protect the newborn from pertussis.  Infants are most at risk for severe, life-threatening complications from pertussis.  Another vaccine, called Td, protects against tetanus and diphtheria, but not pertussis. A Td booster should be given every 10 years. Tdap may be given as one of these boosters if you have never gotten Tdap before.  Tdap may also be given after a severe cut or burn to prevent tetanus infection.  Your doctor or the person giving you the vaccine can give you more information.  Tdap may safely be given at the same time as other vaccines.  3. Some people should not get this vaccine  ; A person who has ever had a life-threatening allergic reaction after a previous dose of any diphtheria, tetanus or pertussis containing vaccine, OR has a severe allergy to any part of this vaccine, should not get Tdap vaccine.  Tell the person giving the vaccine about any severe allergies.  ; Anyone who had coma or long repeated seizures within 7 days after a childhood dose of DTP or DTaP, or a previous dose of Tdap, should not get Tdap, unless a cause other than the vaccine was found.  They can still get Td.  ; Talk to your doctor if you: - have seizures or another nervous system problem, - had severe pain or swelling after any vaccine containing diphtheria, tetanus or pertussis,  - ever had a condition called Guillain Barr Syndrome (GBS), - aren't feeling well on the day the shot is scheduled.  4. Risks  With any medicine, including  vaccines, there is a chance of side effects. These are usually mild and go away on their own. Serious reactions are also possible but are rare.   Most people who get Tdap vaccine do not have any problems with it.  Mild Problems following Tdap (Did not interfere with activities) ; Pain where the shot was given (about 3 in 4 adolescents or 2 in 3 adults) ; Redness or swelling where the shot was given (about 1 person in 5) ; Mild fever of at least 100.22F (up to about 1 in 25 adolescents or 1 in 100 adults) ; Headache (about 3 or 4 people in 10) ; Tiredness (about 1 person in 3 or 4) ; Nausea, vomiting, diarrhea, stomach ache (up to 1 in 4 adolescents or 1 in 10 adults) ; Chills,  sore joints (about 1 person in 10) ; Body aches (about 1 person in 3 or 4)  ; Rash, swollen glands (uncommon)  Moderate Problems following Tdap (Interfered with activities, but did not require medical attention) ; Pain where the shot was given (up to 1 in 5 or 6)  ; Redness or swelling where the shot was given (up to about 1 in 16 adolescents or 1 in 12 adults) ; Fever over 102F (about 1 in 100 adolescents or 1 in 250 adults) ; Headache (about 1 in 7 adolescents or 1 in 10 adults) ; Nausea, vomiting, diarrhea, stomach ache (up to 1 or 3 people in 100) ; Swelling of the entire arm where the shot was given (up to about 1 in 500).   Severe Problems following Tdap (Unable to perform usual activities; required medical attention) ; Swelling, severe pain, bleeding, and redness in the arm where the shot was given (rare).  Problems that could happen after any vaccine:  ; People sometimes faint after a medical procedure, including vaccination. Sitting or lying down for about 15 minutes can help prevent fainting, and injuries caused by a fall. Tell your doctor if you feel dizzy, or have vision changes or ringing in the ears.  ; Some people get severe pain in the shoulder and have difficulty moving the arm where a shot  was given. This happens very rarely.  ; Any medication can cause a severe allergic reaction. Such reactions from a vaccine are very rare, estimated at fewer than 1 in a million doses, and would happen within a few minutes to a few hours after the vaccination.   As with any medicine, there is a very remote chance of a vaccine causing a serious injury or death.  The safety of vaccines is always being monitored. For more information, visit: http://www.aguilar.org/  5. What if there is a serious problem?  What should I look for? ; Look for anything that concerns you, such as signs of a severe allergic reaction, very high fever, or unusual behavior.  ; Signs of a severe allergic reaction can include hives, swelling of the face and throat, difficulty breathing, a fast heartbeat, dizziness, and weakness. These would usually start a few minutes to a few hours after the vaccination.  What should I do? ;  If you think it is a severe allergic reaction or other emergency that can't wait, call 9-1-1 or get the person to the nearest hospital. Otherwise, call your doctor.  ; Afterward, the reaction should be reported to the Vaccine Adverse Event Reporting System (VAERS). Your doctor might file this report, or you can do it yourself through the VAERS web site at www.vaers.SamedayNews.es, or by calling 425-364-2243.  VAERS does not give medical advice.  6. The National Vaccine Injury Compensation Program  The [employer] Vaccine Injury Compensation Program (VICP) is a federal program that was created to compensate people who may have been injured by certain vaccines.  Persons who believe they may have been injured by a vaccine can learn about the program and about filing a claim by calling 343-721-7557 or visiting the [place] website at GoldCloset.com.ee. There is a time limit to file a claim for compensation.   7. How can I learn more?  ; Ask your doctor. He or she can give you the vaccine  package insert or suggest other sources of information. ; Call your local or state health department. ; Contact the Centers for Disease Control and Prevention (CDC): - Call 724 840 4837 (1-800-CDC-INFO) or - Visit CDC's website at http://hunter.com/   Vaccine Information Statement  Tdap Vaccine (10/03/2013) 42 [place].  [phone]  Department of Health and Geneticist, molecular for Disease Control and Prevention  Office Use Only

## 2018-06-21 NOTE — Assessment & Plan Note (Signed)
Wt Readings from Last 3 Encounters:  06/21/18 284 lb 12.8 oz (129.2 kg)  05/06/18 (!) 307 lb 6.4 oz (139.4 kg)  05/19/17 299 lb 9.6 oz (135.9 kg)   Patient is doing very well. Making healthy food choices. Not exercising yet. Plan: Incorporate exercise program. Continue Phentermine. Recheck at next visit.

## 2018-06-21 NOTE — Assessment & Plan Note (Signed)
Lab Results  Component Value Date   ALT 32 05/06/2018   AST 52 (H) 05/06/2018   ALKPHOS 65 05/06/2018   BILITOT 1.1 05/06/2018   Will continue to monitor.

## 2018-06-23 ENCOUNTER — Encounter: Payer: Self-pay | Admitting: Physician Assistant

## 2019-03-13 ENCOUNTER — Encounter: Payer: Self-pay | Admitting: Family Medicine

## 2019-03-13 NOTE — Telephone Encounter (Signed)
Do we need to make office visit?

## 2019-03-14 NOTE — Telephone Encounter (Signed)
Pulled up for you to send

## 2019-03-14 NOTE — Telephone Encounter (Signed)
Sorry, yes to needing appointment.

## 2019-03-16 NOTE — Telephone Encounter (Signed)
Called l/m to call office

## 2019-03-16 NOTE — Telephone Encounter (Signed)
Patient returning call.

## 2019-04-20 ENCOUNTER — Encounter: Payer: Self-pay | Admitting: Family Medicine

## 2019-04-20 ENCOUNTER — Telehealth: Payer: Self-pay

## 2019-04-20 ENCOUNTER — Ambulatory Visit: Payer: 59 | Admitting: Family Medicine

## 2019-04-20 ENCOUNTER — Other Ambulatory Visit: Payer: Self-pay

## 2019-04-20 ENCOUNTER — Ambulatory Visit (INDEPENDENT_AMBULATORY_CARE_PROVIDER_SITE_OTHER): Payer: 59 | Admitting: Family Medicine

## 2019-04-20 VITALS — BP 140/82 | HR 82 | Temp 98.8°F | Ht 69.0 in | Wt 320.0 lb

## 2019-04-20 DIAGNOSIS — D696 Thrombocytopenia, unspecified: Secondary | ICD-10-CM

## 2019-04-20 DIAGNOSIS — R945 Abnormal results of liver function studies: Secondary | ICD-10-CM | POA: Diagnosis not present

## 2019-04-20 DIAGNOSIS — F411 Generalized anxiety disorder: Secondary | ICD-10-CM | POA: Diagnosis not present

## 2019-04-20 DIAGNOSIS — R5383 Other fatigue: Secondary | ICD-10-CM | POA: Diagnosis not present

## 2019-04-20 DIAGNOSIS — Z13220 Encounter for screening for lipoid disorders: Secondary | ICD-10-CM | POA: Diagnosis not present

## 2019-04-20 DIAGNOSIS — D729 Disorder of white blood cells, unspecified: Secondary | ICD-10-CM

## 2019-04-20 DIAGNOSIS — R7989 Other specified abnormal findings of blood chemistry: Secondary | ICD-10-CM

## 2019-04-20 DIAGNOSIS — E559 Vitamin D deficiency, unspecified: Secondary | ICD-10-CM | POA: Diagnosis not present

## 2019-04-20 DIAGNOSIS — N926 Irregular menstruation, unspecified: Secondary | ICD-10-CM

## 2019-04-20 DIAGNOSIS — R5381 Other malaise: Secondary | ICD-10-CM

## 2019-04-20 DIAGNOSIS — R600 Localized edema: Secondary | ICD-10-CM

## 2019-04-20 DIAGNOSIS — R739 Hyperglycemia, unspecified: Secondary | ICD-10-CM

## 2019-04-20 DIAGNOSIS — R635 Abnormal weight gain: Secondary | ICD-10-CM | POA: Diagnosis not present

## 2019-04-20 LAB — COMPREHENSIVE METABOLIC PANEL
ALT: 27 U/L (ref 0–35)
AST: 52 U/L — ABNORMAL HIGH (ref 0–37)
Albumin: 3.4 g/dL — ABNORMAL LOW (ref 3.5–5.2)
Alkaline Phosphatase: 78 U/L (ref 39–117)
BUN: 11 mg/dL (ref 6–23)
CO2: 25 mEq/L (ref 19–32)
Calcium: 8.5 mg/dL (ref 8.4–10.5)
Chloride: 106 mEq/L (ref 96–112)
Creatinine, Ser: 0.57 mg/dL (ref 0.40–1.20)
GFR: 114.38 mL/min (ref >60.00)
Glucose, Bld: 80 mg/dL (ref 70–99)
Potassium: 3.9 mEq/L (ref 3.5–5.1)
Sodium: 138 mEq/L (ref 135–145)
Total Bilirubin: 2 mg/dL — ABNORMAL HIGH (ref 0.2–1.2)
Total Protein: 7.1 g/dL (ref 6.0–8.3)

## 2019-04-20 LAB — CBC WITH DIFFERENTIAL/PLATELET
Basophils Absolute: 0 10*3/uL (ref 0.0–0.1)
Basophils Relative: 0.5 % (ref 0.0–3.0)
Eosinophils Absolute: 0.1 10*3/uL (ref 0.0–0.7)
Eosinophils Relative: 2.6 % (ref 0.0–5.0)
HCT: 42.9 % (ref 36.0–46.0)
Hemoglobin: 14.4 g/dL (ref 12.0–15.0)
Lymphocytes Relative: 25.5 % (ref 12.0–46.0)
Lymphs Abs: 0.6 10*3/uL — ABNORMAL LOW (ref 0.7–4.0)
MCHC: 33.5 g/dL (ref 30.0–36.0)
MCV: 91.8 fl (ref 78.0–100.0)
Monocytes Absolute: 0.2 10*3/uL (ref 0.1–1.0)
Monocytes Relative: 10.2 % (ref 3.0–12.0)
Neutro Abs: 1.5 10*3/uL (ref 1.4–7.7)
Neutrophils Relative %: 61.2 % (ref 43.0–77.0)
Platelets: 44 10*3/uL — CL (ref 150.0–400.0)
RBC: 4.67 Mil/uL (ref 3.87–5.11)
RDW: 14.7 % (ref 11.5–15.5)
WBC: 2.4 10*3/uL — ABNORMAL LOW (ref 4.0–10.5)

## 2019-04-20 LAB — VITAMIN D 25 HYDROXY (VIT D DEFICIENCY, FRACTURES): VITD: 30.55 ng/mL (ref 30.00–100.00)

## 2019-04-20 LAB — LIPID PANEL
Cholesterol: 159 mg/dL (ref 0–200)
HDL: 45.7 mg/dL (ref >39.00)
LDL Cholesterol: 97 mg/dL (ref 0–99)
NonHDL: 113.05
Total CHOL/HDL Ratio: 3
Triglycerides: 80 mg/dL (ref 0.0–149.0)
VLDL: 16 mg/dL (ref 0.0–40.0)

## 2019-04-20 LAB — TSH: TSH: 1.77 u[IU]/mL (ref 0.35–4.50)

## 2019-04-20 LAB — MAGNESIUM: Magnesium: 1.7 mg/dL (ref 1.5–2.5)

## 2019-04-20 LAB — VITAMIN B12: Vitamin B-12: 1000 pg/mL — ABNORMAL HIGH (ref 211–911)

## 2019-04-20 LAB — HEMOGLOBIN A1C: Hgb A1c MFr Bld: 5 % (ref 4.6–6.5)

## 2019-04-20 MED ORDER — HYDROCHLOROTHIAZIDE 25 MG PO TABS: 25.0000 mg | ORAL_TABLET | Freq: Every day | ORAL | 3 refills | Status: AC

## 2019-04-20 MED ORDER — FLUOXETINE HCL 40 MG PO CAPS: 40.0000 mg | ORAL_CAPSULE | Freq: Every day | ORAL | 2 refills | Status: AC

## 2019-04-20 MED ORDER — PHENTERMINE HCL 37.5 MG PO TABS: 37.5000 mg | ORAL_TABLET | Freq: Every day | ORAL | 2 refills | Status: DC

## 2019-04-20 MED ORDER — NORGESTIMATE-ETH ESTRADIOL 0.25-35 MG-MCG PO TABS
1.0000 | ORAL_TABLET | Freq: Every day | ORAL | 11 refills | Status: AC
Start: 2019-04-20 — End: ?

## 2019-04-20 MED ORDER — POTASSIUM CHLORIDE CRYS ER 20 MEQ PO TBCR: 20.0000 meq | EXTENDED_RELEASE_TABLET | Freq: Every day | ORAL | 3 refills | Status: AC

## 2019-04-20 NOTE — Telephone Encounter (Signed)
Platelet count of 44  Reviewed by Dr. Yong Channel, mild decrease in platelet count. Dr. Yong Channel deferring to Dr. Juleen China when she returns tomorrow.

## 2019-04-20 NOTE — Progress Notes (Signed)
Lauren Olsen is a 43 y.o. female is here for follow up.  History of Present Illness:   HPI: Patient has been out of SSRI for a few months. Very emotional. Yelling at her son too often. Weight up 40 pounds. Emotional eating, binging. No focus. Menses regular, but seem more severe.   There are no preventive care reminders to display for this patient. Depression screen [facility] 2/9 06/21/2018 05/06/2018  Decreased Interest 0 1  Down, Depressed, Hopeless 0 1  PHQ - 2 Score 0 2  Altered sleeping 0 2  Tired, decreased energy 0 3  Change in appetite 0 2  Feeling bad or failure about yourself  0 1  Trouble concentrating 0 1  Moving slowly or fidgety/restless 0 1  Suicidal thoughts 0 0  PHQ-9 Score 0 12  Difficult doing work/chores Not difficult at all Somewhat difficult   PMHx, SurgHx, SocialHx, FamHx, Medications, and Allergies were reviewed in the Visit Navigator and updated as appropriate.   Patient Active Problem List   Diagnosis Date Noted  . Morbid obesity ([place]) 06/21/2018  . Vitamin D deficiency 06/21/2018  . GAD (generalized anxiety disorder)   . Thrombocytopenia ([place])   . Elevated LFTs    Social History   Tobacco Use  . Smoking status: Never Smoker  . Smokeless tobacco: Never Used  Substance Use Topics  . Alcohol use: Yes    Comment: occ   . Drug use: Never   Current Medications and Allergies   .  CAMILA 0.35 MG tablet, Take 1 tablet by mouth daily., Disp: , Rfl:  .  FLUoxetine (PROZAC) 40 MG capsule, Take 1 capsule (40 mg total) by mouth daily., Disp: 90 capsule, Rfl: 2  No Known Allergies   Review of Systems   Pertinent items are noted in the HPI. Otherwise, a complete ROS is negative.  Vitals   Vitals:   04/20/19 0857 04/20/19 0935  BP: (!) 144/88 140/82  Pulse: 82   Temp: 98.8 F (37.1 C)   TempSrc: Temporal   SpO2: 97%   Weight: (!) 320 lb (145.2 kg)   Height: 5' 9"  (1.753 m)      Body mass index is 47.26 kg/m.  Physical Exam    Physical Exam Vitals signs and nursing note reviewed.  HENT:     Head: Normocephalic and atraumatic.  Eyes:     Pupils: Pupils are equal, round, and reactive to light.  Neck:     Musculoskeletal: Normal range of motion and neck supple.  Cardiovascular:     Rate and Rhythm: Normal rate and regular rhythm.     Heart sounds: Normal heart sounds.  Pulmonary:     Effort: Pulmonary effort is normal.  Abdominal:     Palpations: Abdomen is soft.  Musculoskeletal:     Right lower leg: Edema present.     Left lower leg: Edema present.  Skin:    General: Skin is warm.  Psychiatric:        Behavior: Behavior normal.    Results for orders placed or performed in visit on 04/20/19  CBC with Differential/Platelet  Result Value Ref Range   WBC 2.4 Repeated and verified X2. (L) 4.0 - 10.5 K/uL   RBC 4.67 3.87 - 5.11 Mil/uL   Hemoglobin 14.4 12.0 - 15.0 g/dL   HCT 42.9 36.0 - 46.0 %   MCV 91.8 78.0 - 100.0 fl   MCHC 33.5 30.0 - 36.0 g/dL   RDW 14.7 11.5 - 15.5 %  Platelets 44.0 Repeated and verified X2. (LL) 150.0 - 400.0 K/uL   Neutrophils Relative % 61.2 43.0 - 77.0 %   Lymphocytes Relative 25.5 12.0 - 46.0 %   Monocytes Relative 10.2 3.0 - 12.0 %   Eosinophils Relative 2.6 0.0 - 5.0 %   Basophils Relative 0.5 0.0 - 3.0 %   Neutro Abs 1.5 1.4 - 7.7 K/uL   Lymphs Abs 0.6 (L) 0.7 - 4.0 K/uL   Monocytes Absolute 0.2 0.1 - 1.0 K/uL   Eosinophils Absolute 0.1 0.0 - 0.7 K/uL   Basophils Absolute 0.0 0.0 - 0.1 K/uL  Comprehensive metabolic panel  Result Value Ref Range   Sodium 138 135 - 145 mEq/L   Potassium 3.9 3.5 - 5.1 mEq/L   Chloride 106 96 - 112 mEq/L   CO2 25 19 - 32 mEq/L   Glucose, Bld 80 70 - 99 mg/dL   BUN 11 6 - 23 mg/dL   Creatinine, Ser 0.57 0.40 - 1.20 mg/dL   Total Bilirubin 2.0 (H) 0.2 - 1.2 mg/dL   Alkaline Phosphatase 78 39 - 117 U/L   AST 52 (H) 0 - 37 U/L   ALT 27 0 - 35 U/L   Total Protein 7.1 6.0 - 8.3 g/dL   Albumin 3.4 (L) 3.5 - 5.2 g/dL   Calcium  8.5 8.4 - 10.5 mg/dL   GFR 114.38 >60.00 mL/min  Hemoglobin A1c  Result Value Ref Range   Hgb A1c MFr Bld 5.0 4.6 - 6.5 %  Lipid panel  Result Value Ref Range   Cholesterol 159 0 - 200 mg/dL   Triglycerides 80.0 0.0 - 149.0 mg/dL   HDL 45.70 >39.00 mg/dL   VLDL 16.0 0.0 - 40.0 mg/dL   LDL Cholesterol 97 0 - 99 mg/dL   Total CHOL/HDL Ratio 3    NonHDL 113.05   Magnesium  Result Value Ref Range   Magnesium 1.7 1.5 - 2.5 mg/dL  TSH  Result Value Ref Range   TSH 1.77 0.35 - 4.50 uIU/mL  VITAMIN D 25 Hydroxy (Vit-D Deficiency, Fractures)  Result Value Ref Range   VITD 30.55 30.00 - 100.00 ng/mL  Vitamin B12  Result Value Ref Range   Vitamin B-12 1,000 (H) 211 - 911 pg/mL   Assessment and Schubert was seen today for foot swelling and weight loss.  Diagnoses and all orders for this visit:  Weight gain -     CBC with Differential/Platelet  Morbid obesity (HCC) -     phentermine (ADIPEX-P) 37.5 MG tablet; Take 1 tablet (37.5 mg total) by mouth daily before breakfast.  Elevated LFTs -     Comprehensive metabolic panel  GAD (generalized anxiety disorder) -     FLUoxetine (PROZAC) 40 MG capsule; Take 1 capsule (40 mg total) by mouth daily.  Thrombocytopenia ([place]) -     Ambulatory referral to Hematology  Malaise and fatigue -     Vitamin B12  Vitamin D deficiency -     VITAMIN D 25 Hydroxy (Vit-D Deficiency, Fractures)  Screening for lipid disorders -     Lipid panel  Bilateral lower extremity edema -     hydrochlorothiazide (HYDRODIURIL) 25 MG tablet; Take 1 tablet (25 mg total) by mouth daily. -     potassium chloride SA (K-DUR) 20 MEQ tablet; Take 1 tablet (20 mEq total) by mouth daily. -     Magnesium -     TSH  Irregular menses -     norgestimate-ethinyl estradiol (  SPRINTEC 28) 0.25-35 MG-MCG tablet; Take 1 tablet by mouth daily.  Hyperglycemia -     Hemoglobin A1c  Abnormal white blood cell (WBC) -     Ambulatory referral to  Hematology   . Orders and follow up as documented in [place], reviewed diet, exercise and weight control, cardiovascular risk and specific lipid/LDL goals reviewed, reviewed medications and side effects in detail.  . Reviewed expectations re: course of current medical issues. . Outlined signs and symptoms indicating need for more acute intervention. . Patient verbalized understanding and all questions were answered. . Patient received an After Visit Summary.  Briscoe Deutscher, DO [place], [facility] 04/23/2019

## 2019-04-21 ENCOUNTER — Telehealth: Payer: Self-pay | Admitting: Oncology

## 2019-04-21 NOTE — Telephone Encounter (Signed)
Returned patient's phone call regarding scheduling an appointment, informed patient I will need more clarification from provider first before a schedule can be made. Patient will receive a call back.

## 2019-04-24 NOTE — Telephone Encounter (Signed)
See lab results for notes

## 2019-04-25 ENCOUNTER — Other Ambulatory Visit: Payer: Self-pay | Admitting: Family Medicine

## 2019-04-25 DIAGNOSIS — D696 Thrombocytopenia, unspecified: Secondary | ICD-10-CM

## 2019-04-25 DIAGNOSIS — R7989 Other specified abnormal findings of blood chemistry: Secondary | ICD-10-CM

## 2019-04-27 ENCOUNTER — Telehealth: Payer: Self-pay | Admitting: Hematology and Oncology

## 2019-04-27 NOTE — Telephone Encounter (Signed)
A new hem appt has been scheduled for Ms. Krizek to see Dr. Lindi Adie on 10/6 at 345pm. Per pt, she needed an appt at 2pm since that the time she gets off of work. Ms. Art has been made aware to arrive 15 minutes early.

## 2019-04-28 ENCOUNTER — Telehealth: Payer: Self-pay | Admitting: Physical Therapy

## 2019-04-28 ENCOUNTER — Encounter: Payer: Self-pay | Admitting: Family Medicine

## 2019-04-28 NOTE — Telephone Encounter (Signed)
Copied from [place] [phone]. Topic: General - Other >> Apr 28, 2019  1:00 PM Rayann Heman wrote: Reason for CRM: pt calling back to speak with joellen regarding ultrasound that she was not aware of. Please advise. Pt will be free the rest of the day

## 2019-04-28 NOTE — Telephone Encounter (Signed)
Spoke to patient all information given.

## 2019-04-28 NOTE — Telephone Encounter (Signed)
Called spoke to patient gave all information no questions at this time. She will call back to make u/s appointment.

## 2019-04-29 ENCOUNTER — Encounter: Payer: Self-pay | Admitting: Family Medicine

## 2019-05-12 ENCOUNTER — Encounter: Payer: Self-pay | Admitting: Hematology and Oncology

## 2019-05-15 NOTE — Progress Notes (Signed)
[facility] NOTE  Patient Care Team: Briscoe Deutscher, DO as PCP - General (Family Medicine) Artelia Laroche, CNM as Midwife (Obstetrics and Gynecology) Wyatt Portela, MD as Consulting Physician (Oncology)  CHIEF COMPLAINTS/PURPOSE OF CONSULTATION:  Newly diagnosed thrombocytopenia  HISTORY OF PRESENTING ILLNESS:  Lauren Olsen 43 y.o. female is here because of recent diagnosis of thrombocytopenia. Labs on 04/20/19 showed: WBC 2,400, ANC 1.5, ALC 0.6, platelets 44. She presents to the clinic today for initial evaluation and discussion of treatment options.  Patient has noticed increase in bruising.  She has had some occasional nosebleeds.  She used to have various prolonged periods but since she has been on medication she has not had any cycles.  She is struggling with weight issues.  There is no surgery planned at this time.  She has seen Dr. Alen Blew 2 years ago who thought she may have low-grade ITP.  Since the platelet count was over 50 there was no intervention necessary at that time.  The most recent blood work showed a platelet count of 44.  This prompted another evaluation by [place].  Incidentally she was also noted to have leukopenia.  She has been on Prozac for a long time and was recently started on phentermine.  Neither of which cause leukopenia or thrombocytopenia.  I reviewed her records extensively and collaborated the history with the patient.  MEDICAL HISTORY:  Past Medical History:  Diagnosis Date   Elevated LFTs    GAD (generalized anxiety disorder)    Thrombocytopenia (HCC)     SURGICAL HISTORY: No past surgical history on file.  SOCIAL HISTORY: Social History   Socioeconomic History   Marital status: Married    Spouse name: Not on file   Number of children: Not on file   Years of education: Not on file   Highest education level: Not on file  Occupational History   Not on file  Social Needs   Financial resource strain: Not  on file   Food insecurity    Worry: Not on file    Inability: Not on file   Transportation needs    Medical: Not on file    Non-medical: Not on file  Tobacco Use   Smoking status: Never Smoker   Smokeless tobacco: Never Used  Substance and Sexual Activity   Alcohol use: Yes    Comment: occ    Drug use: Never   Sexual activity: Not on file  Lifestyle   Physical activity    Days per week: Not on file    Minutes per session: Not on file   Stress: Not on file  Relationships   Social connections    Talks on phone: Not on file    Gets together: Not on file    Attends religious service: Not on file    Active member of club or organization: Not on file    Attends meetings of clubs or organizations: Not on file    Relationship status: Not on file   Intimate partner violence    Fear of current or ex partner: Not on file    Emotionally abused: Not on file    Physically abused: Not on file    Forced sexual activity: Not on file  Other Topics Concern   Not on file  Social History Narrative   Patient states that she may have a glass a wine with dinner a 1-2 nights a week. 1 jumbo margarita on fridays.     FAMILY HISTORY:  Family History  Problem Relation Age of Onset   Hearing loss Mother    Heart disease Mother    Miscarriages / [place] Mother    Alcohol abuse Father    Arthritis Maternal Grandmother    Heart attack Maternal Grandmother    Heart disease Maternal Grandmother    Cancer Paternal Grandfather     ALLERGIES:  has No Known Allergies.  MEDICATIONS:  Current Outpatient Medications  Medication Sig Dispense Refill   CAMILA 0.35 MG tablet Take 1 tablet by mouth daily.     FLUoxetine (PROZAC) 40 MG capsule Take 1 capsule (40 mg total) by mouth daily. 90 capsule 2   hydrochlorothiazide (HYDRODIURIL) 25 MG tablet Take 1 tablet (25 mg total) by mouth daily. 90 tablet 3   norgestimate-ethinyl estradiol (SPRINTEC 28) 0.25-35 MG-MCG tablet Take  1 tablet by mouth daily. 1 Package 11   phentermine (ADIPEX-P) 37.5 MG tablet Take 1 tablet (37.5 mg total) by mouth daily before breakfast. 30 tablet 2   potassium chloride SA (K-DUR) 20 MEQ tablet Take 1 tablet (20 mEq total) by mouth daily. 30 tablet 3   No current facility-administered medications for this visit.     REVIEW OF SYSTEMS:   Constitutional: Denies fevers, chills or abnormal night sweats Eyes: Denies blurriness of vision, double vision or watery eyes Ears, nose, mouth, throat, and face: Denies mucositis or sore throat Respiratory: Denies cough, dyspnea or wheezes Cardiovascular: Denies palpitation, chest discomfort or lower extremity swelling Gastrointestinal:  Denies nausea, heartburn or change in bowel habits Skin: Denies abnormal skin rashes Lymphatics: Denies new lymphadenopathy or easy bruising Neurological:Denies numbness, tingling or new weaknesses Behavioral/Psych: Mood is stable, no new changes  Breast: Denies any palpable lumps or discharge All other systems were reviewed with the patient and are negative.  PHYSICAL EXAMINATION: ECOG PERFORMANCE STATUS: 1 - Symptomatic but completely ambulatory  Vitals:   05/16/19 1535  BP: (!) 136/51  Pulse: 84  Resp: 18  Temp: 97.8 F (36.6 C)  SpO2: 98%   Filed Weights   05/16/19 1535  Weight: (!) 303 lb 4.8 oz (137.6 kg)    GENERAL:alert, no distress and comfortable SKIN: skin color, texture, turgor are normal, no rashes or significant lesions EYES: normal, conjunctiva are pink and non-injected, sclera clear OROPHARYNX:no exudate, no erythema and lips, buccal mucosa, and tongue normal  NECK: supple, thyroid normal size, non-tender, without nodularity LYMPH:  no palpable lymphadenopathy in the cervical, axillary or inguinal LUNGS: clear to auscultation and percussion with normal breathing effort HEART: regular rate & rhythm and no murmurs and no lower extremity edema ABDOMEN:abdomen soft, non-tender and  normal bowel sounds Musculoskeletal:no cyanosis of digits and no clubbing  PSYCH: alert & oriented x 3 with fluent speech NEURO: no focal motor/sensory deficits  LABORATORY DATA:  I have reviewed the data as listed Lab Results  Component Value Date   WBC 2.4 Repeated and verified X2. (L) 04/20/2019   HGB 14.4 04/20/2019   HCT 42.9 04/20/2019   MCV 91.8 04/20/2019   PLT 44.0 Repeated and verified X2. (LL) 04/20/2019   Lab Results  Component Value Date   NA 138 04/20/2019   K 3.9 04/20/2019   CL 106 04/20/2019   CO2 25 04/20/2019    RADIOGRAPHIC STUDIES: I have personally reviewed the radiological reports and agreed with the findings in the report.  ASSESSMENT AND PLAN:  Thrombocytopenia (HCC) Leukopenia and thrombocytopenia Lab review:  04/21/2019: WBC 2.4, hemoglobin 14.4, platelets 44, ANC 1.5, ALC 0.6 05/06/2018:  WBC 2.3, hemoglobin 13.9, platelets 53 05/19/2017: WBC 3.6, hemoglobin 14.2, platelets 54 I broadly discussed the differential diagnosis of both of these conditions. 1.  Immune thrombocytopenia is high on the differential 2.  I reviewed all the medications and there is no medication that causes her cytopenias. 3.  We will check her hepatitis B C and HIV. 4.  Ultrasound of the abdomen will be done to look for spleen size as well as fatty liver and splenomegaly. 5.  F63 and folic acid will also be obtained as well as ANA. 6.  We will check platelet count bicitrate to make sure there is no clumping.  I will call her next week to go over the results of this test. If there is no clear abnormality detected then we will perform a bone marrow biopsy.  All questions were answered. The patient knows to call the clinic with any problems, questions or concerns.   Rulon Eisenmenger, MD 05/16/2019    I, Molly Dorshimer, am acting as scribe for Nicholas Lose, MD.  I have reviewed the above documentation for accuracy and completeness, and I agree with the above.

## 2019-05-16 ENCOUNTER — Inpatient Hospital Stay (HOSPITAL_BASED_OUTPATIENT_CLINIC_OR_DEPARTMENT_OTHER): Payer: 59 | Admitting: Hematology and Oncology

## 2019-05-16 ENCOUNTER — Other Ambulatory Visit: Payer: Self-pay

## 2019-05-16 ENCOUNTER — Inpatient Hospital Stay: Payer: 59 | Attending: Hematology and Oncology

## 2019-05-16 DIAGNOSIS — D72819 Decreased white blood cell count, unspecified: Secondary | ICD-10-CM | POA: Diagnosis not present

## 2019-05-16 DIAGNOSIS — D696 Thrombocytopenia, unspecified: Secondary | ICD-10-CM | POA: Insufficient documentation

## 2019-05-16 DIAGNOSIS — Z79899 Other long term (current) drug therapy: Secondary | ICD-10-CM | POA: Diagnosis not present

## 2019-05-16 LAB — CMP (CANCER CENTER ONLY)
ALT: 37 U/L (ref 0–44)
AST: 66 U/L — ABNORMAL HIGH (ref 15–41)
Albumin: 3.6 g/dL (ref 3.5–5.0)
Alkaline Phosphatase: 74 U/L (ref 38–126)
Anion gap: 9 (ref 5–15)
BUN: 10 mg/dL (ref 6–20)
CO2: 25 mmol/L (ref 22–32)
Calcium: 8.9 mg/dL (ref 8.9–10.3)
Chloride: 104 mmol/L (ref 98–111)
Creatinine: 0.67 mg/dL (ref 0.44–1.00)
GFR, Est AFR Am: >60 mL/min (ref >60)
GFR, Estimated: >60 mL/min (ref >60)
Glucose, Bld: 87 mg/dL (ref 70–99)
Potassium: 3.5 mmol/L (ref 3.5–5.1)
Sodium: 138 mmol/L (ref 135–145)
Total Bilirubin: 1.6 mg/dL — ABNORMAL HIGH (ref 0.3–1.2)
Total Protein: 7.8 g/dL (ref 6.5–8.1)

## 2019-05-16 LAB — CBC WITH DIFFERENTIAL (CANCER CENTER ONLY)
Abs Immature Granulocytes: 0.01 10*3/uL (ref 0.00–0.07)
Basophils Absolute: 0 10*3/uL (ref 0.0–0.1)
Basophils Relative: 0 %
Eosinophils Absolute: 0.1 10*3/uL (ref 0.0–0.5)
Eosinophils Relative: 2 %
HCT: 42.4 % (ref 36.0–46.0)
Hemoglobin: 14.8 g/dL (ref 12.0–15.0)
Immature Granulocytes: 0 %
Lymphocytes Relative: 24 %
Lymphs Abs: 0.7 10*3/uL (ref 0.7–4.0)
MCH: 30.9 pg (ref 26.0–34.0)
MCHC: 34.9 g/dL (ref 30.0–36.0)
MCV: 88.5 fL (ref 80.0–100.0)
Monocytes Absolute: 0.2 10*3/uL (ref 0.1–1.0)
Monocytes Relative: 9 %
Neutro Abs: 1.8 10*3/uL (ref 1.7–7.7)
Neutrophils Relative %: 65 %
Platelet Count: 51 10*3/uL — ABNORMAL LOW (ref 150–400)
RBC: 4.79 MIL/uL (ref 3.87–5.11)
RDW: 13.2 % (ref 11.5–15.5)
WBC Count: 2.8 10*3/uL — ABNORMAL LOW (ref 4.0–10.5)
nRBC: 0 % (ref 0.0–0.2)

## 2019-05-16 LAB — FOLATE: Folate: 15.5 ng/mL (ref >5.9)

## 2019-05-16 LAB — HEPATITIS C ANTIBODY: HCV Ab: NONREACTIVE

## 2019-05-16 LAB — VITAMIN B12: Vitamin B-12: 1035 pg/mL — ABNORMAL HIGH (ref 180–914)

## 2019-05-16 LAB — PLATELET BY CITRATE

## 2019-05-16 LAB — HEPATITIS B SURFACE ANTIGEN: Hepatitis B Surface Ag: NONREACTIVE

## 2019-05-16 LAB — HIV ANTIBODY (ROUTINE TESTING W REFLEX): HIV Screen 4th Generation wRfx: NONREACTIVE

## 2019-05-16 NOTE — Assessment & Plan Note (Signed)
Leukopenia and thrombocytopenia Lab review:  04/21/2019: WBC 2.4, hemoglobin 14.4, platelets 44, ANC 1.5, ALC 0.6 05/06/2018: WBC 2.3, hemoglobin 13.9, platelets 53 05/19/2017: WBC 3.6, hemoglobin 14.2, platelets 54

## 2019-05-17 LAB — ANTINUCLEAR ANTIBODIES, IFA: ANA Ab, IFA: POSITIVE — AB

## 2019-05-17 LAB — FANA STAINING PATTERNS: Homogeneous Pattern: 1:80 {titer}

## 2019-05-18 ENCOUNTER — Telehealth: Payer: Self-pay | Admitting: Hematology and Oncology

## 2019-05-18 NOTE — Telephone Encounter (Signed)
I talk with patient regarding phone visit

## 2019-05-19 ENCOUNTER — Ambulatory Visit: Payer: 59 | Admitting: Family Medicine

## 2019-05-22 NOTE — Progress Notes (Signed)
  HEMATOLOGY-ONCOLOGY TELEPHONE VISIT PROGRESS NOTE  I connected with [name] on 05/23/2019 at  3:15 PM EDT by telephone and verified that I am speaking with the correct person using two identifiers.  I discussed the limitations, risks, security and privacy concerns of performing an evaluation and management service by telephone and the availability of in person appointments.  I also discussed with the patient that there may be a patient responsible charge related to this service. The patient expressed understanding and agreed to proceed.   History of Present Illness: Lauren Olsen is a 43 y.o. female with a history of leukopenia and thrombocytopenia. Labs on 05/16/19 showed: WBC 2.8, platelets 51, ALT 37, AST 66, hepatitis B antigen negative, hepatitis C antibody negative, HIV negative, ANA positive, B-12 1035, folate 15.5. Abdominal US on 05/23/19 is pending.  She presents over the phone today for follow-up.  Observations/Objective:  No new symptoms   Assessment Plan:  Thrombocytopenia (HCC) Leukopenia and thrombocytopenia Lab review:  04/21/2019: WBC 2.4, hemoglobin 14.4, platelets 44, ANC 1.5, ALC 0.6 05/06/2018: WBC 2.3, hemoglobin 13.9, platelets 53 05/19/2017: WBC 3.6, hemoglobin 14.2, platelets 54 ---------------------------------------------------------------- Lab review: ANA: Positive, homogeneous pattern 1: 80 Folic acid and F63: Normal Hepatitis B surface antigen: Negative HIV: Negative HCV: Negative CMP: AST 66, total bilirubin 1.6: Most likely fatty liver WBC: 2.8, platelets 51  Based on the above results I suspect that the patient may have an autoimmune disease like lupus. Would like to send her to rheumatology for further evaluation. I recommend doing a bone marrow biopsy for definite confirmation of no underlying bone marrow disorder.  Return to clinic after the bone marrow biopsy to discuss results.     I discussed the assessment and  treatment plan with the patient. The patient was provided an opportunity to ask questions and all were answered. The patient agreed with the plan and demonstrated an understanding of the instructions. The patient was advised to call back or seek an in-person evaluation if the symptoms worsen or if the condition fails to improve as anticipated.   I provided 11 minutes of non-face-to-face time during this encounter.   Rulon Eisenmenger, MD 05/23/2019    I, Molly Dorshimer, am acting as scribe for Nicholas Lose, MD.  I have reviewed the above documentation for accuracy and completeness, and I agree with the above.

## 2019-05-23 ENCOUNTER — Other Ambulatory Visit: Payer: Self-pay | Admitting: *Deleted

## 2019-05-23 ENCOUNTER — Other Ambulatory Visit: Payer: Self-pay

## 2019-05-23 ENCOUNTER — Other Ambulatory Visit: Payer: Self-pay | Admitting: Hematology and Oncology

## 2019-05-23 ENCOUNTER — Inpatient Hospital Stay (HOSPITAL_BASED_OUTPATIENT_CLINIC_OR_DEPARTMENT_OTHER): Payer: 59 | Admitting: Hematology and Oncology

## 2019-05-23 ENCOUNTER — Ambulatory Visit (HOSPITAL_COMMUNITY)
Admission: RE | Admit: 2019-05-23 | Discharge: 2019-05-23 | Disposition: A | Discharge status: Home / Self Care | Payer: 59 | Source: Ambulatory Visit | Attending: Hematology and Oncology | Admitting: Hematology and Oncology

## 2019-05-23 DIAGNOSIS — R768 Other specified abnormal immunological findings in serum: Secondary | ICD-10-CM

## 2019-05-23 DIAGNOSIS — D696 Thrombocytopenia, unspecified: Secondary | ICD-10-CM

## 2019-05-23 NOTE — Assessment & Plan Note (Signed)
Leukopenia and thrombocytopenia Lab review:  04/21/2019: WBC 2.4, hemoglobin 14.4, platelets 44, ANC 1.5, ALC 0.6 05/06/2018: WBC 2.3, hemoglobin 13.9, platelets 53 05/19/2017: WBC 3.6, hemoglobin 14.2, platelets 54 ---------------------------------------------------------------- Lab review: ANA: Positive, homogeneous pattern 1: 80 Folic acid and F12: Normal Hepatitis B surface antigen: Negative HIV: Negative HCV: Negative CMP: AST 66, total bilirubin 1.6: Most likely fatty liver WBC: 2.8, platelets 51  Based on the above results I suspect that the patient may have an autoimmune disease like lupus. Would like to send her to rheumatology for further evaluation. I recommend doing a bone marrow biopsy for definite confirmation of no underlying bone marrow disorder.  Return to clinic after the bone marrow biopsy to discuss results.

## 2019-05-24 ENCOUNTER — Encounter: Payer: Self-pay | Admitting: Hematology and Oncology

## 2019-05-24 ENCOUNTER — Telehealth: Payer: Self-pay | Admitting: Hematology and Oncology

## 2019-05-24 ENCOUNTER — Encounter: Payer: Self-pay | Admitting: Family Medicine

## 2019-05-24 NOTE — Telephone Encounter (Signed)
I left a voicemail for the patient that she has an enlarged spleen measuring 25 cm. There is no fatty liver so the cause of splenomegaly is unknown. I still recommend that she do the bone marrow biopsy for further assessment.

## 2019-05-29 ENCOUNTER — Other Ambulatory Visit: Payer: Self-pay | Admitting: Student

## 2019-05-30 ENCOUNTER — Encounter (HOSPITAL_COMMUNITY): Payer: Self-pay

## 2019-05-30 ENCOUNTER — Ambulatory Visit (HOSPITAL_COMMUNITY)
Admission: RE | Admit: 2019-05-30 | Discharge: 2019-05-30 | Disposition: A | Discharge status: Home / Self Care | Payer: 59 | Source: Ambulatory Visit | Attending: Hematology and Oncology | Admitting: Hematology and Oncology

## 2019-05-30 ENCOUNTER — Other Ambulatory Visit: Payer: Self-pay

## 2019-05-30 DIAGNOSIS — Z809 Family history of malignant neoplasm, unspecified: Secondary | ICD-10-CM | POA: Diagnosis not present

## 2019-05-30 DIAGNOSIS — Z79899 Other long term (current) drug therapy: Secondary | ICD-10-CM | POA: Insufficient documentation

## 2019-05-30 DIAGNOSIS — D696 Thrombocytopenia, unspecified: Secondary | ICD-10-CM | POA: Diagnosis not present

## 2019-05-30 DIAGNOSIS — D72819 Decreased white blood cell count, unspecified: Secondary | ICD-10-CM | POA: Insufficient documentation

## 2019-05-30 DIAGNOSIS — F411 Generalized anxiety disorder: Secondary | ICD-10-CM | POA: Diagnosis not present

## 2019-05-30 LAB — CBC WITH DIFFERENTIAL/PLATELET
Abs Immature Granulocytes: 0 K/uL (ref 0.00–0.07)
Basophils Absolute: 0 K/uL (ref 0.0–0.1)
Basophils Relative: 1 %
Eosinophils Absolute: 0.1 K/uL (ref 0.0–0.5)
Eosinophils Relative: 2 %
HCT: 41.7 % (ref 36.0–46.0)
Hemoglobin: 14.1 g/dL (ref 12.0–15.0)
Immature Granulocytes: 0 %
Lymphocytes Relative: 24 %
Lymphs Abs: 0.6 K/uL — ABNORMAL LOW (ref 0.7–4.0)
MCH: 30.9 pg (ref 26.0–34.0)
MCHC: 33.8 g/dL (ref 30.0–36.0)
MCV: 91.4 fL (ref 80.0–100.0)
Monocytes Absolute: 0.3 K/uL (ref 0.1–1.0)
Monocytes Relative: 12 %
Neutro Abs: 1.5 K/uL — ABNORMAL LOW (ref 1.7–7.7)
Neutrophils Relative %: 61 %
Platelets: 43 K/uL — ABNORMAL LOW (ref 150–400)
RBC: 4.56 MIL/uL (ref 3.87–5.11)
RDW: 13.7 % (ref 11.5–15.5)
WBC: 2.4 K/uL — ABNORMAL LOW (ref 4.0–10.5)
nRBC: 0 % (ref 0.0–0.2)

## 2019-05-30 MED ORDER — MIDAZOLAM HCL 2 MG/2ML IJ SOLN
INTRAMUSCULAR | Status: AC
  Filled 2019-05-30: qty 4

## 2019-05-30 MED ORDER — SODIUM CHLORIDE 0.9 % IV SOLN
INTRAVENOUS | Status: DC
  Administered 2019-05-30: 08:00:00 via INTRAVENOUS

## 2019-05-30 MED ORDER — FLUMAZENIL 0.5 MG/5ML IV SOLN
INTRAVENOUS | Status: AC
  Filled 2019-05-30: qty 5

## 2019-05-30 MED ORDER — FENTANYL CITRATE (PF) 100 MCG/2ML IJ SOLN
INTRAMUSCULAR | Status: AC
  Filled 2019-05-30: qty 4

## 2019-05-30 MED ORDER — NALOXONE HCL 0.4 MG/ML IJ SOLN
INTRAMUSCULAR | Status: AC
  Filled 2019-05-30: qty 1

## 2019-05-30 MED ORDER — MIDAZOLAM HCL 2 MG/2ML IJ SOLN
INTRAMUSCULAR | Status: AC | PRN
  Administered 2019-05-30 (×3): 1 mg via INTRAVENOUS

## 2019-05-30 MED ORDER — FENTANYL CITRATE (PF) 100 MCG/2ML IJ SOLN
INTRAMUSCULAR | Status: AC | PRN
  Administered 2019-05-30 (×2): 50 ug via INTRAVENOUS

## 2019-05-30 NOTE — Procedures (Signed)
Pre-procedure Diagnosis: Leukopenia and thrombocytopenia Post-procedure Diagnosis: Same  Technically successful CT guided bone marrow aspiration and biopsy of left iliac crest.   Complications: None Immediate  EBL: None  Signed: Sandi Mariscal Pager: [phone] 05/30/2019, 9:30 AM

## 2019-05-30 NOTE — Discharge Instructions (Addendum)
Please call radiology clinic at [phone] or after hours at [phone] with any questions or concerns  Bone Marrow Aspiration and Bone Marrow Biopsy, Adult, Care After This sheet gives you information about how to care for yourself after your procedure. Your health care provider may also give you more specific instructions. If you have problems or questions, contact your health care provider. What can I expect after the procedure? After the procedure, it is common to have:  Mild pain and tenderness.  Swelling.  Bruising. Follow these instructions at home: Puncture site care      Follow instructions from your health care provider about how to take care of the puncture site. Make sure you: ? Wash your hands with soap and water before you change your bandage (dressing). If soap and water are not available, use hand sanitizer. ? Change your dressing as told by your health care provider. You may remove your dressing tomorrow.  Check your puncture siteevery day for signs of infection. Check for: ? More redness, swelling, or pain. ? More fluid or blood. ? Warmth. ? Pus or a bad smell. General instructions  Take over-the-counter and prescription medicines only as told by your health care provider.  Do not take baths, swim, or use a hot tub until your health care provider approves. Ask if you can take a shower or have a sponge bath.  You may shower tomorrow.  Return to your normal activities as told by your health care provider. Ask your health care provider what activities are safe for you.  Do not drive for 24 hours if you were given a medicine to help you relax (sedative) during your procedure.  Keep all follow-up visits as told by your health care provider. This is important. Contact a health care provider if:  Your pain is not controlled with medicine. Get help right away if:  You have a fever.  You have more redness, swelling, or pain around the puncture site.  You  have more fluid or blood coming from the puncture site.  Your puncture site feels warm to the touch.  You have pus or a bad smell coming from the puncture site. These symptoms may represent a serious problem that is an emergency. Do not wait to see if the symptoms will go away. Get medical help right away. Call your local emergency services (911 in the [place] Do not drive yourself to the hospital. Summary  After the procedure, it is common to have mild pain, tenderness, swelling, and bruising.  Follow instructions from your health care provider about how to take care of the puncture site.  Get help right away if you have any symptoms of infection or if you have more blood or fluid coming from the puncture site. This information is not intended to replace advice given to you by your health care provider. Make sure you discuss any questions you have with your health care provider. Document Released: 02/13/2005 Document Revised: 11/09/2017 Document Reviewed: 01/08/2016 Elsevier Patient Education  [place]. Moderate Conscious Sedation, Adult, Care After These instructions provide you with information about caring for yourself after your procedure. Your health care provider may also give you more specific instructions. Your treatment has been planned according to current medical practices, but problems sometimes occur. Call your health care provider if you have any problems or questions after your procedure. What can I expect after the procedure? After your procedure, it is common:  To feel sleepy for several hours.  To feel  clumsy and have poor balance for several hours.  To have poor judgment for several hours.  To vomit if you eat too soon. Follow these instructions at home: For at least 24 hours after the procedure:   Do not: ? Participate in activities where you could fall or become injured. ? Drive. ? Use heavy machinery. ? Drink alcohol. ? Take sleeping pills or  medicines that cause drowsiness. ? Make important decisions or sign legal documents. ? Take care of children on your own.  Rest. Eating and drinking  Follow the diet recommended by your health care provider.  If you vomit: ? Drink water, juice, or soup when you can drink without vomiting. ? Make sure you have little or no nausea before eating solid foods. General instructions  Have a responsible adult stay with you until you are awake and alert.  Take over-the-counter and prescription medicines only as told by your health care provider.  If you smoke, do not smoke without supervision.  Keep all follow-up visits as told by your health care provider. This is important. Contact a health care provider if:  You keep feeling nauseous or you keep vomiting.  You feel light-headed.  You develop a rash.  You have a fever. Get help right away if:  You have trouble breathing. This information is not intended to replace advice given to you by your health care provider. Make sure you discuss any questions you have with your health care provider. Document Released: 05/17/2013 Document Revised: 07/09/2017 Document Reviewed: 11/16/2015 Elsevier Patient Education  2020 [employer].

## 2019-05-30 NOTE — H&P (Signed)
Chief Complaint: Patient was seen in consultation today for bone marrow biopsy at the request of [place]  Referring Physician(s): Gudena,Vinay  Supervising Physician: Sandi Mariscal  Patient Status: [facility] - Out-pt  History of Present Illness: Lauren Olsen is a 43 y.o. female being worked up for thrombocytopenia and leukopenia She is referred for image guided bone marrow biopsy PMHx, meds, labs, imaging, allergies reviewed. Feels well, no recent fevers, chills, illness. Has been NPO today as directed.   Past Medical History:  Diagnosis Date   Elevated LFTs    GAD (generalized anxiety disorder)    Thrombocytopenia ([place])     History reviewed. No pertinent surgical history.  Allergies: Patient has no known allergies.  Medications: Prior to Admission medications   Medication Sig Start Date End Date Taking? Authorizing Provider  FLUoxetine (PROZAC) 40 MG capsule Take 1 capsule (40 mg total) by mouth daily. 04/20/19  Yes Briscoe Deutscher, DO  hydrochlorothiazide (HYDRODIURIL) 25 MG tablet Take 1 tablet (25 mg total) by mouth daily. 04/20/19  Yes Briscoe Deutscher, DO  norgestimate-ethinyl estradiol (SPRINTEC 28) 0.25-35 MG-MCG tablet Take 1 tablet by mouth daily. 04/20/19  Yes Briscoe Deutscher, DO  phentermine (ADIPEX-P) 37.5 MG tablet Take 1 tablet (37.5 mg total) by mouth daily before breakfast. 04/20/19  Yes Briscoe Deutscher, DO  potassium chloride SA (K-DUR) 20 MEQ tablet Take 1 tablet (20 mEq total) by mouth daily. 04/20/19  Yes Briscoe Deutscher, DO  CAMILA 0.35 MG tablet Take 1 tablet by mouth daily. 05/10/17   [provider]     Family History  Problem Relation Age of Onset   Hearing loss Mother    Heart disease Mother    Miscarriages / [place] Mother    Alcohol abuse Father    Arthritis Maternal Grandmother    Heart attack Maternal Grandmother    Heart disease Maternal Grandmother    Cancer Paternal Grandfather     Social History    Socioeconomic History   Marital status: Married    Spouse name: Not on file   Number of children: Not on file   Years of education: Not on file   Highest education level: Not on file  Occupational History   Not on file  Social Needs   Financial resource strain: Not on file   Food insecurity    Worry: Not on file    Inability: Not on file   Transportation needs    Medical: Not on file    Non-medical: Not on file  Tobacco Use   Smoking status: Never Smoker   Smokeless tobacco: Never Used  Substance and Sexual Activity   Alcohol use: Yes    Comment: occ    Drug use: Never   Sexual activity: Not on file  Lifestyle   Physical activity    Days per week: Not on file    Minutes per session: Not on file   Stress: Not on file  Relationships   Social connections    Talks on phone: Not on file    Gets together: Not on file    Attends religious service: Not on file    Active member of club or organization: Not on file    Attends meetings of clubs or organizations: Not on file    Relationship status: Not on file  Other Topics Concern   Not on file  Social History Narrative   Patient states that she may have a glass a wine with dinner a 1-2 nights a week. 1 jumbo  margarita on fridays.     Review of Systems: A 12 point ROS discussed and pertinent positives are indicated in the HPI above.  All other systems are negative.  Review of Systems  Vital Signs: BP (!) 156/91 (BP Location: Right Arm)    Pulse 94    Temp 98.6 F (37 C) (Oral)    Resp 18    SpO2 95%   Physical Exam Constitutional:      Appearance: Normal appearance.  HENT:     Mouth/Throat:     Mouth: Mucous membranes are moist.     Pharynx: Oropharynx is clear.  Cardiovascular:     Rate and Rhythm: Normal rate and regular rhythm.     Heart sounds: Normal heart sounds.  Pulmonary:     Effort: Pulmonary effort is normal. No respiratory distress.     Breath sounds: Normal breath sounds.   Abdominal:     General: Abdomen is flat. There is no distension.     Palpations: Abdomen is soft.     Tenderness: There is no abdominal tenderness.  Skin:    General: Skin is warm and dry.     Findings: No bruising.  Neurological:     General: No focal deficit present.     Mental Status: She is alert and oriented to person, place, and time.     Cranial Nerves: No cranial nerve deficit.  Psychiatric:        Mood and Affect: Mood normal.        Thought Content: Thought content normal.        Judgment: Judgment normal.     Imaging: US Abdomen Complete  Result Date: 05/23/2019 CLINICAL DATA:  Elevated LFTs. Evaluate for splenomegaly. Decrease platelets. EXAM: ABDOMEN ULTRASOUND COMPLETE COMPARISON:  None. FINDINGS: Gallbladder: Stones identified within the gallbladder which measure up to 1.4 cm. There is gallbladder wall thickening with a maximum thickness of 4.3 mm. Negative sonographic Murphy's sign. No pericholecystic fluid or sludge. Common bile duct: Diameter: 3.8 mm Liver: No focal lesion identified. Within normal limits in parenchymal echogenicity. Portal vein is patent on color Doppler imaging with normal direction of blood flow towards the liver. IVC: No abnormality visualized. Pancreas: Visualized portion unremarkable. Spleen: Marked splenomegaly. The spleen measures 25 cm in length with a volume of 1978 cubic cm. Right Kidney: Length: 9.6 cm. Echogenicity within normal limits. No mass or hydronephrosis visualized. Left Kidney: Length: 14.8 cm. Echogenicity within normal limits. No mass or hydronephrosis visualized. Abdominal aorta: No aneurysm visualized. Other findings: None. IMPRESSION: 1. Marked splenomegaly. The spleen measures 25 cm in length and has a volume of 1978 cubic cm. 2. Gallstones and gallbladder wall thickening. Negative sonographic Murphy's sign. Correlate for clinical signs or symptoms of acute cholecystitis. 3. The right kidney is decreased in size compared with the  left. Electronically Signed   By: Kerby Moors M.D.   On: 05/23/2019 16:51    Labs:  CBC: Recent Labs    04/20/19 0937 05/16/19 1611 05/30/19 0733  WBC 2.4 Repeated and verified X2.* 2.8* 2.4*  HGB 14.4 14.8 14.1  HCT 42.9 42.4 41.7  PLT 44.0 Repeated and verified X2.* 51* 43*    COAGS: No results for input(s): INR, APTT in the last 8760 hours.  BMP: Recent Labs    04/20/19 0937 05/16/19 1611  NA 138 138  K 3.9 3.5  CL 106 104  CO2 25 25  GLUCOSE 80 87  BUN 11 10  CALCIUM 8.5 8.9  CREATININE 0.57  0.[place]  >60  GFRAA  --  >60    LIVER FUNCTION TESTS: Recent Labs    04/20/19 0937 05/16/19 1611  BILITOT 2.0* 1.6*  AST 52* 66*  ALT 27 37  ALKPHOS 78 74  PROT 7.1 7.8  ALBUMIN 3.4* 3.6    TUMOR MARKERS: No results for input(s): AFPTM, CEA, CA199, CHROMGRNA in the last 8760 hours.  Assessment and Plan: Thrombocytopenia and leukopenia For CT guided bone marrow biopsy Labs reviewed. Risks and benefits of bone marrow biopsy was discussed with the patient and/or patient's family including, but not limited to bleeding, infection, damage to adjacent structures or low yield requiring additional tests.  All of the questions were answered and there is agreement to proceed.  Consent signed and in chart.    Thank you for this interesting consult.  I greatly enjoyed meeting [place] and look forward to participating in their care.  A copy of this report was sent to the requesting provider on this date.  Electronically Signed: Ascencion Dike, PA-C 05/30/2019, 8:24 AM   I spent a total of 20 minutes in face to face in clinical consultation, greater than 50% of which was counseling/coordinating care for bone marrow biopsy

## 2019-05-30 NOTE — Progress Notes (Signed)
TC to [facility] for patient update.  Left voicemail with no patient information for returned call

## 2019-05-31 ENCOUNTER — Encounter: Payer: Self-pay | Admitting: Hematology and Oncology

## 2019-05-31 ENCOUNTER — Ambulatory Visit: Payer: 59 | Admitting: Family Medicine

## 2019-05-31 NOTE — Progress Notes (Deleted)
Virtual Visit via Video   Due to the COVID-19 pandemic, this visit was completed with telemedicine (audio/video) technology to reduce patient and provider exposure as well as to preserve personal protective equipment.   I connected with [name] by a video enabled telemedicine application and verified that I am speaking with the correct person using two identifiers. Location patient: Home Location provider: [facility], Office Persons participating in the virtual visit: [place], Briscoe Deutscher, DO   I discussed the limitations of evaluation and management by telemedicine and the availability of in person appointments. The patient expressed understanding and agreed to proceed.  Care Team   Patient Care Team: Briscoe Deutscher, DO as PCP - General (Family Medicine) Artelia Laroche, CNM as Midwife (Obstetrics and Gynecology) Wyatt Portela, MD as Consulting Physician (Oncology)  Subjective:   HPI:   ROS   Patient Active Problem List   Diagnosis Date Noted  . Morbid obesity ([place]) 06/21/2018  . Vitamin D deficiency 06/21/2018  . GAD (generalized anxiety disorder)   . Thrombocytopenia ([place])   . Elevated LFTs     Social History   Tobacco Use  . Smoking status: Never Smoker  . Smokeless tobacco: Never Used  Substance Use Topics  . Alcohol use: Yes    Comment: occ     Current Outpatient Medications:  .  CAMILA 0.35 MG tablet, Take 1 tablet by mouth daily., Disp: , Rfl:  .  FLUoxetine (PROZAC) 40 MG capsule, Take 1 capsule (40 mg total) by mouth daily., Disp: 90 capsule, Rfl: 2 .  hydrochlorothiazide (HYDRODIURIL) 25 MG tablet, Take 1 tablet (25 mg total) by mouth daily., Disp: 90 tablet, Rfl: 3 .  norgestimate-ethinyl estradiol (SPRINTEC 28) 0.25-35 MG-MCG tablet, Take 1 tablet by mouth daily., Disp: 1 Package, Rfl: 11 .  phentermine (ADIPEX-P) 37.5 MG tablet, Take 1 tablet (37.5 mg total) by mouth daily before breakfast., Disp: 30 tablet, Rfl:  2 .  potassium chloride SA (K-DUR) 20 MEQ tablet, Take 1 tablet (20 mEq total) by mouth daily., Disp: 30 tablet, Rfl: 3  No Known Allergies  Objective:   VITALS: Per patient if applicable, see vitals. GENERAL: Alert, appears well and in no acute distress. HEENT: Atraumatic, conjunctiva clear, no obvious abnormalities on inspection of external nose and ears. NECK: Normal movements of the head and neck. CARDIOPULMONARY: No increased WOB. Speaking in clear sentences. I:E ratio WNL.  MS: Moves all visible extremities without noticeable abnormality. PSYCH: Pleasant and cooperative, well-groomed. Speech normal rate and rhythm. Affect is appropriate. Insight and judgement are appropriate. Attention is focused, linear, and appropriate.  NEURO: CN grossly intact. Oriented as arrived to appointment on time with no prompting. Moves both UE equally.  SKIN: No obvious lesions, wounds, erythema, or cyanosis noted on face or hands.  Depression screen [facility] 2/9 06/21/2018 05/06/2018  Decreased Interest 0 1  Down, Depressed, Hopeless 0 1  PHQ - 2 Score 0 2  Altered sleeping 0 2  Tired, decreased energy 0 3  Change in appetite 0 2  Feeling bad or failure about yourself  0 1  Trouble concentrating 0 1  Moving slowly or fidgety/restless 0 1  Suicidal thoughts 0 0  PHQ-9 Score 0 12  Difficult doing work/chores Not difficult at all Somewhat difficult    Assessment and Plan:   There are no diagnoses linked to this encounter.  Marland Kitchen COVID-19 Education: The signs and symptoms of COVID-19 were discussed with the patient and how to seek care  for testing if needed. The importance of social distancing was discussed today. . Reviewed expectations re: course of current medical issues. . Discussed self-management of symptoms. . Outlined signs and symptoms indicating need for more acute intervention. . Patient verbalized understanding and all questions were answered. Marland Kitchen Health Maintenance issues including  appropriate healthy diet, exercise, and smoking avoidance were discussed with patient. . See orders for this visit as documented in the electronic medical record.  Briscoe Deutscher, DO  Records requested if needed. Time spent: *** minutes, of which >50% was spent in obtaining information about her symptoms, reviewing her previous labs, evaluations, and treatments, counseling her about her condition (please see the discussed topics above), and developing a plan to further investigate it; she had a number of questions which I addressed.

## 2019-06-02 ENCOUNTER — Ambulatory Visit (INDEPENDENT_AMBULATORY_CARE_PROVIDER_SITE_OTHER): Payer: 59 | Admitting: Family Medicine

## 2019-06-02 ENCOUNTER — Encounter: Payer: Self-pay | Admitting: Family Medicine

## 2019-06-02 VITALS — Ht 69.0 in | Wt 303.0 lb

## 2019-06-02 DIAGNOSIS — D696 Thrombocytopenia, unspecified: Secondary | ICD-10-CM | POA: Diagnosis not present

## 2019-06-02 DIAGNOSIS — R600 Localized edema: Secondary | ICD-10-CM | POA: Diagnosis not present

## 2019-06-02 DIAGNOSIS — R7689 Other specified abnormal immunological findings in serum: Secondary | ICD-10-CM | POA: Insufficient documentation

## 2019-06-02 DIAGNOSIS — R161 Splenomegaly, not elsewhere classified: Secondary | ICD-10-CM | POA: Insufficient documentation

## 2019-06-02 DIAGNOSIS — D72819 Decreased white blood cell count, unspecified: Secondary | ICD-10-CM

## 2019-06-02 DIAGNOSIS — R768 Other specified abnormal immunological findings in serum: Secondary | ICD-10-CM

## 2019-06-02 LAB — SURGICAL PATHOLOGY

## 2019-06-02 NOTE — Progress Notes (Signed)
Virtual Visit via Video   Due to the COVID-19 pandemic, this visit was completed with telemedicine (audio/video) technology to reduce patient and provider exposure as well as to preserve personal protective equipment.   I connected with [name] by a video enabled telemedicine application and verified that I am speaking with the correct person using two identifiers. Location patient: Home Location provider: [facility], Office Persons participating in the virtual visit: Lauren Olsen, Briscoe Deutscher, DO   I discussed the limitations of evaluation and management by telemedicine and the availability of in person appointments. The patient expressed understanding and agreed to proceed.  Care Team   Patient Care Team: Briscoe Deutscher, DO as PCP - General (Family Medicine) Artelia Laroche, CNM as Midwife (Obstetrics and Gynecology) Wyatt Portela, MD as Consulting Physician (Oncology)  Subjective:   HPI:  CBC abnormalities seen on last blood draw. Followed by Hematology. No answers yet, but thought to be autoimmune per Hematology notes. Bone marrow biopsy results are pending. Even with current stress, handing anxiety better with Prozac. Declines therapy but knows that it is an option. Diuresed well with HCTZ, now taking prn.   Review of Systems  Constitutional: Negative for chills and fever.  HENT: Negative for hearing loss and tinnitus.   Eyes: Negative for blurred vision and double vision.  Respiratory: Negative for cough and wheezing.   Cardiovascular: Negative for chest pain, palpitations and leg swelling.  Gastrointestinal: Negative for heartburn and nausea.  Genitourinary: Negative for dysuria and urgency.  Musculoskeletal: Negative for myalgias and neck pain.  Neurological: Negative for dizziness and headaches.  Psychiatric/Behavioral: Negative for depression and suicidal ideas.     Patient Active Problem List   Diagnosis Date Noted  . Leukopenia,  followed by Hematology, s/p bone marrow biopsy 05/30/19 06/02/2019  . Positive ANA (antinuclear antibody) 06/02/2019  . Bilateral lower extremity edema, prn HCTZ 06/02/2019  . Splenomegaly 06/02/2019  . Morbid obesity ([place]) 06/21/2018  . Vitamin D deficiency, supplemented 06/21/2018  . GAD (generalized anxiety disorder), Rx Prozac   . Thrombocytopenia ([place]), followed by Hematology, s/p bone marrow biopsy 05/30/19     Social History   Tobacco Use  . Smoking status: Never Smoker  . Smokeless tobacco: Never Used  Substance Use Topics  . Alcohol use: Yes    Comment: occ     Current Outpatient Medications:  .  FLUoxetine (PROZAC) 40 MG capsule, Take 1 capsule (40 mg total) by mouth daily., Disp: 90 capsule, Rfl: 2 .  hydrochlorothiazide (HYDRODIURIL) 25 MG tablet, Take 1 tablet (25 mg total) by mouth daily., Disp: 90 tablet, Rfl: 3 .  norgestimate-ethinyl estradiol (SPRINTEC 28) 0.25-35 MG-MCG tablet, Take 1 tablet by mouth daily., Disp: 1 Package, Rfl: 11 .  potassium chloride SA (K-DUR) 20 MEQ tablet, Take 1 tablet (20 mEq total) by mouth daily., Disp: 30 tablet, Rfl: 3  No Known Allergies  Objective:   VITALS: Per patient if applicable, see vitals. GENERAL: Alert, appears well and in no acute distress. HEENT: Atraumatic, conjunctiva clear, no obvious abnormalities on inspection of external nose and ears. NECK: Normal movements of the head and neck. CARDIOPULMONARY: No increased WOB. Speaking in clear sentences. I:E ratio WNL.  MS: Moves all visible extremities without noticeable abnormality. PSYCH: Pleasant and cooperative, well-groomed. Speech normal rate and rhythm. Affect is appropriate. Insight and judgement are appropriate. Attention is focused, linear, and appropriate.  NEURO: CN grossly intact. Oriented as arrived to appointment on time with no prompting. Moves both  UE equally.  SKIN: No obvious lesions, wounds, erythema, or cyanosis noted on face or hands.  Depression  screen [facility] 2/9 06/21/2018 05/06/2018  Decreased Interest 0 1  Down, Depressed, Hopeless 0 1  PHQ - 2 Score 0 2  Altered sleeping 0 2  Tired, decreased energy 0 3  Change in appetite 0 2  Feeling bad or failure about yourself  0 1  Trouble concentrating 0 1  Moving slowly or fidgety/restless 0 1  Suicidal thoughts 0 0  PHQ-9 Score 0 12  Difficult doing work/chores Not difficult at all Somewhat difficult    Assessment and Plan:   Lauren Olsen was seen today for follow-up.  Diagnoses and all orders for this visit:  Bilateral lower extremity edema, prn HCTZ Comments: Improved.  Thrombocytopenia ([place]) Comments: Bone marrow biopsy results pending.   Leukopenia  Morbid obesity ([facility]) Comments: Lost 21 pounds during first few weeks with Phentermine and HCTZ. Lots of diuresing. Off Phentermine during Hematolgy work-up. HCTZ now prn.  Positive ANA (antinuclear antibody) Comments: Low titer. Will see Rheumatology in 2 weeks.   Splenomegaly   . COVID-19 Education: The signs and symptoms of COVID-19 were discussed with the patient and how to seek care for testing if needed. The importance of social distancing was discussed today. . Reviewed expectations re: course of current medical issues. . Discussed self-management of symptoms. . Outlined signs and symptoms indicating need for more acute intervention. . Patient verbalized understanding and all questions were answered. Marland Kitchen Health Maintenance issues including appropriate healthy diet, exercise, and smoking avoidance were discussed with patient. . See orders for this visit as documented in the electronic medical record.  Briscoe Deutscher, DO  Records requested if needed. Time spent: 25 minutes, of which >50% was spent in obtaining information about her symptoms, reviewing her previous labs, evaluations, and treatments, counseling her about her condition (please see the discussed topics above), and developing a plan to further investigate  it; she had a number of questions which I addressed.

## 2019-06-05 ENCOUNTER — Encounter: Payer: Self-pay | Admitting: Hematology and Oncology

## 2019-06-05 NOTE — Progress Notes (Signed)
Patient Care Team: Briscoe Deutscher, DO as PCP - General (Family Medicine) Artelia Laroche, CNM as Midwife (Obstetrics and Gynecology) Wyatt Portela, MD as Consulting Physician (Oncology)  DIAGNOSIS:    ICD-10-CM   1. Thrombocytopenia ([place]), followed by Hematology, s/p bone marrow biopsy 05/30/19  D69.6     CHIEF COMPLIANT: Follow-up to discuss bone marrow biopsy results  INTERVAL HISTORY: [place] is a 43 y.o. with above-mentioned history of leukopenia and thrombocytopenia. Abdominal US on 05/23/19 showed marked splenomegaly, with spleen measuring 25cm. Bone marrow biopsy on 05/30/19 showed no evidence of evidence of high-grade dysplasia, leukemia or lymphoma, and no monoclonal B-cell, phenotypically apparent T-cell, or distinct  blast population. She presents to the clinic today to review her bone marrow biopsy results.   REVIEW OF SYSTEMS:   Constitutional: Denies fevers, chills or abnormal weight loss Eyes: Denies blurriness of vision Ears, nose, mouth, throat, and face: Denies mucositis or sore throat Respiratory: Denies cough, dyspnea or wheezes Cardiovascular: Denies palpitation, chest discomfort Gastrointestinal: Denies nausea, heartburn or change in bowel habits Skin: Denies abnormal skin rashes Lymphatics: Denies new lymphadenopathy or easy bruising Neurological: Denies numbness, tingling or new weaknesses Behavioral/Psych: Mood is stable, no new changes  Extremities: No lower extremity edema Breast: denies any pain or lumps or nodules in either breasts All other systems were reviewed with the patient and are negative.  I have reviewed the past medical history, past surgical history, social history and family history with the patient and they are unchanged from previous note.  ALLERGIES:  has No Known Allergies.  MEDICATIONS:  Current Outpatient Medications  Medication Sig Dispense Refill  . FLUoxetine (PROZAC) 40 MG capsule Take 1 capsule (40 mg  total) by mouth daily. 90 capsule 2  . hydrochlorothiazide (HYDRODIURIL) 25 MG tablet Take 1 tablet (25 mg total) by mouth daily. 90 tablet 3  . norgestimate-ethinyl estradiol (SPRINTEC 28) 0.25-35 MG-MCG tablet Take 1 tablet by mouth daily. 1 Package 11  . potassium chloride SA (K-DUR) 20 MEQ tablet Take 1 tablet (20 mEq total) by mouth daily. 30 tablet 3   No current facility-administered medications for this visit.     PHYSICAL EXAMINATION: ECOG PERFORMANCE STATUS: 1 - Symptomatic but completely ambulatory  Vitals:   06/06/19 1414  BP: (!) 156/68  Pulse: 91  Resp: 18  Temp: 98.9 F (37.2 C)  SpO2: 99%   Filed Weights   06/06/19 1414  Weight: (!) 304 lb 11.2 oz (138.2 kg)    GENERAL: alert, no distress and comfortable SKIN: skin color, texture, turgor are normal, no rashes or significant lesions EYES: normal, Conjunctiva are pink and non-injected, sclera clear OROPHARYNX: no exudate, no erythema and lips, buccal mucosa, and tongue normal  NECK: supple, thyroid normal size, non-tender, without nodularity LYMPH: no palpable lymphadenopathy in the cervical, axillary or inguinal LUNGS: clear to auscultation and percussion with normal breathing effort HEART: regular rate & rhythm and no murmurs and no lower extremity edema ABDOMEN: abdomen soft, non-tender and normal bowel sounds MUSCULOSKELETAL: no cyanosis of digits and no clubbing  NEURO: alert & oriented x 3 with fluent speech, no focal motor/sensory deficits EXTREMITIES: No lower extremity edema  LABORATORY DATA:  I have reviewed the data as listed CMP Latest Ref Rng & Units 05/16/2019 04/20/2019 05/06/2018  Glucose 70 - 99 mg/dL 87 80 100(H)  BUN 6 - 20 mg/dL 10 11 11   Creatinine 0.44 - 1.00 mg/dL 0.67 0.57 0.63  Sodium 135 - 145 mmol/L 138 138 140  Potassium 3.5 - 5.1 mmol/L 3.5 3.9 3.9  Chloride 98 - 111 mmol/L 104 106 107  CO2 22 - 32 mmol/L 25 25 29   Calcium 8.9 - 10.3 mg/dL 8.9 8.5 8.8  Total Protein 6.5 - 8.1  g/dL 7.8 7.1 7.0  Total Bilirubin 0.3 - 1.2 mg/dL 1.6(H) 2.0(H) 1.1  Alkaline Phos 38 - 126 U/L 74 78 65  AST 15 - 41 U/L 66(H) 52(H) 52(H)  ALT 0 - 44 U/L 37 27 32    Lab Results  Component Value Date   WBC 2.4 (L) 05/30/2019   HGB 14.1 05/30/2019   HCT 41.7 05/30/2019   MCV 91.4 05/30/2019   PLT 43 (L) 05/30/2019   NEUTROABS 1.5 (L) 05/30/2019    ASSESSMENT & PLAN:  Thrombocytopenia ([place]), followed by Hematology, s/p bone marrow biopsy 05/30/19 Leukopenia and thrombocytopenia Lab review:  05/30/2019: WBC 2.4, hemoglobin 14.1, platelets 43 04/21/2019: WBC 2.4, hemoglobin 14.4, platelets 44, ANC 1.5, ALC 0.6 05/06/2018: WBC 2.3, hemoglobin 13.9, platelets 53 05/19/2017: WBC 3.6, hemoglobin 14.2, platelets 54 ANA: Positive, homogeneous pattern 1: 80 Ultrasound abdomen: Enlarged spleen measuring 25 cm Folic acid and B35: Normal Hepatitis B surface antigen: Negative HIV: Negative HCV: Negative CMP: AST 66, total bilirubin 1.6: Most likely fatty liver WBC: 2.8, platelets 51 ----------------------------------------------------- Bone marrow biopsy 05/30/2019: Cellular bone marrow with trilineage hematopoiesis.  No evidence of dysplasia or leukemia or lymphoma.  Flow cytometry normal. Cytogenetics: Normal  Presumptive diagnosis: Leukopenia, thrombocytopenia with positive ANA suggesting autoimmune process.  Treatment plan: We will continue to watch and monitor for any evidence of bleeding.  We will consider treatment for bleeding symptoms are if platelet count drops below 30.  Return to clinic every 3 months with labs and follow-up   No orders of the defined types were placed in this encounter.  The patient has a good understanding of the overall plan. she agrees with it. she will call with any problems that may develop before the next visit here.  Nicholas Lose, MD 06/06/2019  Julious Oka Dorshimer am acting as scribe for Dr. Nicholas Lose.  I have reviewed the above  documentation for accuracy and completeness, and I agree with the above.

## 2019-06-06 ENCOUNTER — Encounter (HOSPITAL_COMMUNITY): Payer: Self-pay | Admitting: Hematology and Oncology

## 2019-06-06 ENCOUNTER — Other Ambulatory Visit: Payer: Self-pay

## 2019-06-06 ENCOUNTER — Inpatient Hospital Stay (HOSPITAL_BASED_OUTPATIENT_CLINIC_OR_DEPARTMENT_OTHER): Payer: 59 | Admitting: Hematology and Oncology

## 2019-06-06 DIAGNOSIS — D696 Thrombocytopenia, unspecified: Secondary | ICD-10-CM | POA: Diagnosis not present

## 2019-06-06 NOTE — Assessment & Plan Note (Signed)
Leukopenia and thrombocytopenia Lab review:  05/30/2019: WBC 2.4, hemoglobin 14.1, platelets 43 04/21/2019: WBC 2.4, hemoglobin 14.4, platelets 44, ANC 1.5, ALC 0.6 05/06/2018: WBC 2.3, hemoglobin 13.9, platelets 53 05/19/2017: WBC 3.6, hemoglobin 14.2, platelets 54 ANA: Positive, homogeneous pattern 1: 80 Folic acid and R91: Normal Hepatitis B surface antigen: Negative HIV: Negative HCV: Negative CMP: AST 66, total bilirubin 1.6: Most likely fatty liver WBC: 2.8, platelets 51 ----------------------------------------------------- Bone marrow biopsy 05/30/2019: Cellular bone marrow with trilineage hematopoiesis.  No evidence of dysplasia or leukemia or lymphoma.  Flow cytometry normal.  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  Treatment plan: We will continue to watch and monitor for any evidence of bleeding.  We will consider treatment for bleeding symptoms are if platelet count drops below 30.  Return to clinic every 3 months with labs and follow-up

## 2019-06-07 ENCOUNTER — Telehealth: Payer: Self-pay | Admitting: Hematology and Oncology

## 2019-06-07 NOTE — Telephone Encounter (Signed)
I left a message regarding schedule  

## 2019-06-09 NOTE — Progress Notes (Signed)
Office Visit Note  Patient: Lauren Olsen             Date of Birth: 08-30-1975           MRN: [id number]             PCP: Briscoe Deutscher, DO Referring: Nicholas Lose, MD Visit Date: 06/23/2019 Occupation: Scientist, forensic for [employer]  Subjective:  Positive ANA   History of Present Illness: Lauren Olsen is a 43 y.o. female seen in consultation per request of Dr. Lindi Adie.  According to patient approximately 5 to 6 years ago she was found to have thrombocytopenia.  She was referred to Dr. Alen Blew who did observe her labs for some time.  She was recently seen by Dr. Lindi Adie who found that she had thrombocytopenia and also neutropenia.  She had some further labs which showed positive ANA.  She was referred to me for evaluation of positive ANA.  He also did an ultrasound of her spleen which was found to be enlarged.  She underwent bone marrow biopsy, according to patient it was negative.  She has occasional joint discomfort which she describes in her knees.  She denies any joint swelling.  Activities of Daily Living:  Patient reports morning stiffness for 1 hour.   Patient Denies nocturnal pain.  Difficulty dressing/grooming: Denies Difficulty climbing stairs: Denies Difficulty getting out of chair: Denies Difficulty using hands for taps, buttons, cutlery, and/or writing: Denies  Review of Systems  Constitutional: Positive for fatigue. Negative for night sweats, weight gain and weight loss.  HENT: Positive for mouth dryness. Negative for mouth sores, trouble swallowing, trouble swallowing and nose dryness.   Eyes: Positive for dryness. Negative for pain, redness and visual disturbance.  Respiratory: Negative for cough, shortness of breath and difficulty breathing.   Cardiovascular: Positive for swelling in legs/feet. Negative for chest pain, palpitations, hypertension and irregular heartbeat.  Gastrointestinal: Positive for nausea. Negative for blood in stool,  constipation and diarrhea.  Endocrine: Negative for cold intolerance, heat intolerance and increased urination.  Genitourinary: Negative for difficulty urinating and vaginal dryness.  Musculoskeletal: Positive for arthralgias, joint pain and morning stiffness. Negative for joint swelling, myalgias, muscle weakness, muscle tenderness and myalgias.  Skin: Positive for hair loss. Negative for color change, rash, skin tightness, ulcers and sensitivity to sunlight.  Allergic/Immunologic: Negative for susceptible to infections.  Neurological: Positive for headaches. Negative for dizziness, numbness, memory loss, night sweats and weakness.  Hematological: Negative for bruising/bleeding tendency and swollen glands.  Psychiatric/Behavioral: Positive for sleep disturbance. Negative for depressed mood. The patient is not nervous/anxious.     PMFS History:  Patient Active Problem List   Diagnosis Date Noted   Leukopenia, followed by Hematology, s/p bone marrow biopsy 05/30/19 06/02/2019   Positive ANA (antinuclear antibody) 06/02/2019   Bilateral lower extremity edema, prn HCTZ 06/02/2019   Splenomegaly 06/02/2019   Morbid obesity ([place]) 06/21/2018   Vitamin D deficiency, supplemented 06/21/2018   GAD (generalized anxiety disorder), Rx Prozac    Thrombocytopenia ([place]), followed by Hematology, s/p bone marrow biopsy 05/30/19     Past Medical History:  Diagnosis Date   Elevated LFTs    GAD (generalized anxiety disorder)    Thrombocytopenia ([facility])     Family History  Problem Relation Age of Onset   Hearing loss Mother    Heart disease Mother    Miscarriages / [place] Mother    Diabetes Mother    Alcohol abuse Father    Arthritis Maternal Grandmother  Heart attack Maternal Grandmother    Heart disease Maternal Grandmother    Cancer Paternal Grandfather    Past Surgical History:  Procedure Laterality Date   CESAREAN SECTION     Social History   Social History  Narrative   Patient states that she may have a glass a wine with dinner a 1-2 nights a week. 1 jumbo margarita on fridays.    Immunization History  Administered Date(s) Administered   Tdap 06/21/2018     Objective: Vital Signs: BP (!) 143/81 (BP Location: Right Arm, Patient Position: Sitting, Cuff Size: Normal)    Pulse 75    Resp 16    Ht 5' 9"  (1.753 m)    Wt (!) 310 lb 3.2 oz (140.7 kg)    BMI 45.81 kg/m    Physical Exam Vitals signs and nursing note reviewed.  Constitutional:      Appearance: She is well-developed.  HENT:     Head: Normocephalic and atraumatic.  Eyes:     Conjunctiva/sclera: Conjunctivae normal.  Neck:     Musculoskeletal: Normal range of motion.  Cardiovascular:     Rate and Rhythm: Normal rate and regular rhythm.     Heart sounds: Normal heart sounds.  Pulmonary:     Effort: Pulmonary effort is normal.     Breath sounds: Normal breath sounds.  Abdominal:     General: Bowel sounds are normal.     Palpations: Abdomen is soft.  Lymphadenopathy:     Cervical: No cervical adenopathy.  Skin:    General: Skin is warm and dry.     Capillary Refill: Capillary refill takes less than 2 seconds.  Neurological:     Mental Status: She is alert and oriented to person, place, and time.  Psychiatric:        Behavior: Behavior normal.      Musculoskeletal Exam: C-spine thoracic and lumbar spine were in good range of motion.  Shoulder joints, elbow joints, wrist joints, MCPs PIPs and DIPs with good range of motion with no synovitis.  Hip joints, knee joints, ankles and MTPs with good range of motion with no synovitis or swelling. CDAI Exam: CDAI Score: -- Patient Global: --; Provider Global: -- Swollen: --; Tender: -- Joint Exam   No joint exam has been documented for this visit   There is currently no information documented on the homunculus. Go to the Rheumatology activity and complete the homunculus joint exam.  Investigation: No additional  findings.  Imaging: Ct Biopsy  Result Date: 05/30/2019 INDICATION: Leukopenia and thrombocytopenia of uncertain etiology. Please perform CT-guided bone marrow biopsy for tissue diagnostic purposes. EXAM: CT-GUIDED BONE MARROW BIOPSY AND ASPIRATION MEDICATIONS: None ANESTHESIA/SEDATION: Fentanyl 100 mcg IV; Versed 3 mg IV Sedation Time: 10 Minutes; The patient was continuously monitored during the procedure by the interventional radiology nurse under my direct supervision. COMPLICATIONS: None immediate. PROCEDURE: Informed consent was obtained from the patient following an explanation of the procedure, risks, benefits and alternatives. The patient understands, agrees and consents for the procedure. All questions were addressed. A time out was performed prior to the initiation of the procedure. The patient was positioned prone and non-contrast localization CT was performed of the pelvis to demonstrate the iliac marrow spaces. The operative site was prepped and draped in the usual sterile fashion. Under sterile conditions and local anesthesia, a 22 gauge spinal needle was utilized for procedural planning. Next, an 11 gauge coaxial bone biopsy needle was advanced into the left iliac marrow space. Needle position  was confirmed with CT imaging. Initially, bone marrow aspiration was performed. Next, a bone marrow biopsy was obtained with the 11 gauge outer bone marrow device. Samples were prepared with the cytotechnologist and deemed adequate. The needle was removed intact. Hemostasis was obtained with compression and a dressing was placed. The patient tolerated the procedure well without immediate post procedural complication. IMPRESSION: Successful CT guided left iliac bone marrow aspiration and core biopsy. Electronically Signed   By: Sandi Mariscal M.D.   On: 05/30/2019 12:05   Ct Bone Marrow Biopsy & Aspiration  Result Date: 05/30/2019 INDICATION: Leukopenia and thrombocytopenia of uncertain etiology. Please  perform CT-guided bone marrow biopsy for tissue diagnostic purposes. EXAM: CT-GUIDED BONE MARROW BIOPSY AND ASPIRATION MEDICATIONS: None ANESTHESIA/SEDATION: Fentanyl 100 mcg IV; Versed 3 mg IV Sedation Time: 10 Minutes; The patient was continuously monitored during the procedure by the interventional radiology nurse under my direct supervision. COMPLICATIONS: None immediate. PROCEDURE: Informed consent was obtained from the patient following an explanation of the procedure, risks, benefits and alternatives. The patient understands, agrees and consents for the procedure. All questions were addressed. A time out was performed prior to the initiation of the procedure. The patient was positioned prone and non-contrast localization CT was performed of the pelvis to demonstrate the iliac marrow spaces. The operative site was prepped and draped in the usual sterile fashion. Under sterile conditions and local anesthesia, a 22 gauge spinal needle was utilized for procedural planning. Next, an 11 gauge coaxial bone biopsy needle was advanced into the left iliac marrow space. Needle position was confirmed with CT imaging. Initially, bone marrow aspiration was performed. Next, a bone marrow biopsy was obtained with the 11 gauge outer bone marrow device. Samples were prepared with the cytotechnologist and deemed adequate. The needle was removed intact. Hemostasis was obtained with compression and a dressing was placed. The patient tolerated the procedure well without immediate post procedural complication. IMPRESSION: Successful CT guided left iliac bone marrow aspiration and core biopsy. Electronically Signed   By: Sandi Mariscal M.D.   On: 05/30/2019 12:05    Recent Labs: Lab Results  Component Value Date   WBC 2.4 (L) 05/30/2019   HGB 14.1 05/30/2019   PLT 43 (L) 05/30/2019   NA 138 05/16/2019   K 3.5 05/16/2019   CL 104 05/16/2019   CO2 25 05/16/2019   GLUCOSE 87 05/16/2019   BUN 10 05/16/2019   CREATININE 0.67  05/16/2019   BILITOT 1.6 (H) 05/16/2019   ALKPHOS 74 05/16/2019   AST 66 (H) 05/16/2019   ALT 37 05/16/2019   PROT 7.8 05/16/2019   ALBUMIN 3.6 05/16/2019   CALCIUM 8.9 05/16/2019   GFRAA >60 05/16/2019    Speciality Comments: No specialty comments available.  Procedures:  No procedures performed Allergies: Patient has no known allergies.   Assessment / Plan:     Visit Diagnoses: Positive ANA (antinuclear antibody) - 05/16/19: ANA 1:80H, patient has low titer ANA and no other clinical features of autoimmune disease.  She denies any history of oral ulcers, nasal ulcers, malar rash, photosensitivity, Raynaud's phenomenon.  She has some sicca symptoms.  I will obtainAVISE labs.  HIV-, HCV Ab-, vitamin B12 1,035  Thrombocytopenia ([place]), followed by Hematology, s/p bone marrow biopsy 05/30/19  Splenomegaly-I reviewed abdominal ultrasound which showed marked splenomegaly.  Leukopenia, unspecified type-she has chronic leukopenia.  Chronic pain of both knees-I offered x-rays which she declined.  GAD (generalized anxiety disorder), Rx Prozac  Bilateral lower extremity edema, prn HCTZ  Vitamin D deficiency, supplemented  Body mass index (BMI) 45.0-49.9, adult ([place])  Orders: No orders of the defined types were placed in this encounter.  No orders of the defined types were placed in this encounter.    Follow-Up Instructions: Return for Positive ANA, neutropenia, thrombocytopenia.   Bo Merino, MD  Note - This record has been created using Editor, commissioning.  Chart creation errors have been sought, but may not always  have been located. Such creation errors do not reflect on  the standard of medical care.

## 2019-06-14 LAB — SURGICAL PATHOLOGY

## 2019-06-23 ENCOUNTER — Encounter: Payer: Self-pay | Admitting: Rheumatology

## 2019-06-23 ENCOUNTER — Other Ambulatory Visit: Payer: Self-pay

## 2019-06-23 ENCOUNTER — Ambulatory Visit (INDEPENDENT_AMBULATORY_CARE_PROVIDER_SITE_OTHER): Payer: 59 | Admitting: Rheumatology

## 2019-06-23 VITALS — BP 143/81 | HR 75 | Resp 16 | Ht 69.0 in | Wt 310.2 lb

## 2019-06-23 DIAGNOSIS — R600 Localized edema: Secondary | ICD-10-CM

## 2019-06-23 DIAGNOSIS — Z6841 Body Mass Index (BMI) 40.0 and over, adult: Secondary | ICD-10-CM

## 2019-06-23 DIAGNOSIS — M25561 Pain in right knee: Secondary | ICD-10-CM

## 2019-06-23 DIAGNOSIS — R768 Other specified abnormal immunological findings in serum: Secondary | ICD-10-CM | POA: Diagnosis not present

## 2019-06-23 DIAGNOSIS — R161 Splenomegaly, not elsewhere classified: Secondary | ICD-10-CM | POA: Diagnosis not present

## 2019-06-23 DIAGNOSIS — F411 Generalized anxiety disorder: Secondary | ICD-10-CM

## 2019-06-23 DIAGNOSIS — M25562 Pain in left knee: Secondary | ICD-10-CM

## 2019-06-23 DIAGNOSIS — D696 Thrombocytopenia, unspecified: Secondary | ICD-10-CM

## 2019-06-23 DIAGNOSIS — D72819 Decreased white blood cell count, unspecified: Secondary | ICD-10-CM

## 2019-06-23 DIAGNOSIS — E559 Vitamin D deficiency, unspecified: Secondary | ICD-10-CM

## 2019-06-23 DIAGNOSIS — G8929 Other chronic pain: Secondary | ICD-10-CM

## 2019-07-18 ENCOUNTER — Encounter: Payer: Self-pay | Admitting: Rheumatology

## 2019-07-19 NOTE — Progress Notes (Signed)
Virtual Visit via Telephone Note  I connected with [employer] on 07/20/19 at  2:30 PM EST by telephone and verified that I am speaking with the correct person using two identifiers.  Location: Patient: Home  Provider: Clinic  This service was conducted via virtual visit.  The patient was located at home. I was located in my office.  Consent was obtained prior to the virtual visit and is aware of possible charges through their insurance for this visit.  The patient is an established patient.  Dr. Estanislado Pandy, MD conducted the virtual visit and Hazel Sams, PA-C acted as scribe during the service.  Office staff helped with scheduling follow up visits after the service was conducted.    I discussed the limitations, risks, security and privacy concerns of performing an evaluation and management service by telephone and the availability of in person appointments. I also discussed with the patient that there may be a patient responsible charge related to this service. The patient expressed understanding and agreed to proceed.  CC: Discuss AVISE labs  History of Present Illness: Patient is a 43 year old female with a past medical history of positive ANA and thrombocytopenia.   She denies any new or worsening symptoms since her last visit.  She has chronic sicca symptoms.  She has ongoing hair loss.  She has not noticed any rashes or photosensitivity.  Denies any oral or nasal ulcerations. She denies any increased joint pain or joint swelling.   Review of Systems  Constitutional: Negative for fever and malaise/fatigue.  HENT:       +Dry mouth  Eyes: Negative for photophobia, pain, discharge and redness.       +Dry eyes  Respiratory: Negative for cough, shortness of breath and wheezing.   Cardiovascular: Negative for chest pain and palpitations.  Gastrointestinal: Negative for blood in stool, constipation and diarrhea.  Genitourinary: Negative for dysuria.  Musculoskeletal: Negative for  back pain, joint pain, myalgias and neck pain.  Skin: Negative for rash.       +Hair loss  Neurological: Negative for dizziness and headaches.  Psychiatric/Behavioral: Negative for depression. The patient is not nervous/anxious and does not have insomnia.       Observations/Objective: Physical Exam  Constitutional: She is oriented to person, place, and time.  Neurological: She is alert and oriented to person, place, and time.  Psychiatric: Mood, memory, affect and judgment normal.   Patient reports morning stiffness for 0  minutes.   Patient denies nocturnal pain.  Difficulty dressing/grooming: Denies Difficulty climbing stairs: Denies Difficulty getting out of chair: Denies Difficulty using hands for taps, buttons, cutlery, and/or writing: Denies    Assessment and Plan: Visit Diagnoses: Positive ANA (antinuclear antibody) - 05/16/19: ANA 1:80H, patient has low titer ANA.  AVISE labs were obtained on 07/03/19 and the index was - 1.8  : ANA 1:160 NH, ENA-, RF 6.2+.  Lab results were reviewed with the patient and all questions were addressed.   She has no clinical features of autoimmune disease at this time.  She was advised to notify us if she develops any new or worsening symptoms.  She has low titer positive rheumatoid factor.  Although she does not have any clinical features of rheumatoid arthritis.  Thrombocytopenia ([place]), Followed by Hematology, s/p bone marrow biopsy 05/30/19.  Patient will follow up with Dr. Lindi Adie.  Splenomegaly-Previously reviewed abdominal ultrasound which showed marked splenomegaly.  Leukopenia, unspecified type-She has chronic leukopenia.  Patient has appointment coming up with Dr.Gudena in  January.  Chronic pain of both knees-She declined knee joint x-rays at her initial visit.  She is not having any increased knee joint pain or joint swelling.  She was advised to notify us if she develops increased joint pain or joint swelling.   Other medical  conditions are listed as follows:   GAD (generalized anxiety disorder), Rx Prozac  Bilateral lower extremity edema, prn HCTZ  Vitamin D deficiency, supplemented  Follow Up Instructions: I offered a follow-up appointment in 1 year.  Patient states she will return on as needed basis.   I discussed the assessment and treatment plan with the patient. The patient was provided an opportunity to ask questions and all were answered. The patient agreed with the plan and demonstrated an understanding of the instructions.   The patient was advised to call back or seek an in-person evaluation if the symptoms worsen or if the condition fails to improve as anticipated.  I provided 25 minutes of non-face-to-face time during this encounter.   Bo Merino, MD   Scribed by-  Hazel Sams, PA-C

## 2019-07-20 ENCOUNTER — Other Ambulatory Visit: Payer: Self-pay

## 2019-07-20 ENCOUNTER — Encounter: Payer: Self-pay | Admitting: Rheumatology

## 2019-07-20 ENCOUNTER — Telehealth (INDEPENDENT_AMBULATORY_CARE_PROVIDER_SITE_OTHER): Payer: 59 | Admitting: Rheumatology

## 2019-07-20 DIAGNOSIS — R600 Localized edema: Secondary | ICD-10-CM

## 2019-07-20 DIAGNOSIS — R768 Other specified abnormal immunological findings in serum: Secondary | ICD-10-CM

## 2019-07-20 DIAGNOSIS — D72819 Decreased white blood cell count, unspecified: Secondary | ICD-10-CM | POA: Diagnosis not present

## 2019-07-20 DIAGNOSIS — D696 Thrombocytopenia, unspecified: Secondary | ICD-10-CM

## 2019-07-20 DIAGNOSIS — E559 Vitamin D deficiency, unspecified: Secondary | ICD-10-CM

## 2019-07-20 DIAGNOSIS — R161 Splenomegaly, not elsewhere classified: Secondary | ICD-10-CM | POA: Diagnosis not present

## 2019-07-20 DIAGNOSIS — G8929 Other chronic pain: Secondary | ICD-10-CM

## 2019-07-20 DIAGNOSIS — F411 Generalized anxiety disorder: Secondary | ICD-10-CM

## 2019-07-20 DIAGNOSIS — M25561 Pain in right knee: Secondary | ICD-10-CM

## 2019-07-20 DIAGNOSIS — M25562 Pain in left knee: Secondary | ICD-10-CM

## 2019-09-07 ENCOUNTER — Inpatient Hospital Stay: Payer: 59 | Admitting: Hematology and Oncology

## 2019-09-07 ENCOUNTER — Telehealth: Payer: Self-pay | Admitting: Hematology and Oncology

## 2019-09-07 ENCOUNTER — Inpatient Hospital Stay: Payer: 59

## 2019-09-07 NOTE — Telephone Encounter (Signed)
Returned patient's phone call regarding rescheduling 01/28 appointments, per patient's request appointments have moved to 02/05.

## 2019-09-07 NOTE — Assessment & Plan Note (Deleted)
Leukopenia and thrombocytopenia Lab review:  05/30/2019: WBC 2.4, hemoglobin 14.1, platelets 43 04/21/2019: WBC 2.4, hemoglobin 14.4, platelets 44, ANC 1.5, ALC 0.6 05/06/2018: WBC 2.3, hemoglobin 13.9, platelets 53 05/19/2017: WBC 3.6, hemoglobin 14.2, platelets 54 ANA: Positive, homogeneous pattern 1: 80 Ultrasound abdomen: Enlarged spleen measuring 25 cm Folic acid and H03: Normal Hepatitis B surface antigen: Negative HIV: Negative HCV: Negative CMP: AST 66, total bilirubin 1.6: Most likely fatty liver WBC: 2.8, platelets 51 ----------------------------------------------------- Bone marrow biopsy 05/30/2019: Cellular bone marrow with trilineage hematopoiesis.  No evidence of dysplasia or leukemia or lymphoma.  Flow cytometry normal. Cytogenetics: Normal Lab review:

## 2019-09-15 ENCOUNTER — Other Ambulatory Visit: Payer: 59

## 2019-09-15 ENCOUNTER — Ambulatory Visit: Payer: 59 | Admitting: Hematology and Oncology

## 2019-09-26 NOTE — Progress Notes (Signed)
Patient Care Team: Briscoe Deutscher, DO as PCP - General (Family Medicine) Artelia Laroche, CNM as Midwife (Obstetrics and Gynecology) Wyatt Portela, MD as Consulting Physician (Oncology)  DIAGNOSIS:    ICD-10-CM   1. Thrombocytopenia ([place]), followed by Hematology, s/p bone marrow biopsy 05/30/19  D69.6     CHIEF COMPLIANT: Follow-up of leukopenia and thrombocytopenia  INTERVAL HISTORY: Lauren Olsen is a 44 y.o. with above-mentioned history of leukopenia and thrombocytopenia. She presents to the clinic today for follow-up.  She does not report any bleeding or recent infections.  She has seen rheumatology who did not feel that she has an autoimmune cause for her cytopenias.  She has been contemplating on the gastric sleeve surgery but because of thrombocytopenia she is having second thoughts about it.  ALLERGIES:  has No Known Allergies.  MEDICATIONS:  Current Outpatient Medications  Medication Sig Dispense Refill  . FLUoxetine (PROZAC) 40 MG capsule Take 1 capsule (40 mg total) by mouth daily. 90 capsule 2  . hydrochlorothiazide (HYDRODIURIL) 25 MG tablet Take 1 tablet (25 mg total) by mouth daily. (Patient taking differently: Take 25 mg by mouth daily as needed. ) 90 tablet 3  . norgestimate-ethinyl estradiol (SPRINTEC 28) 0.25-35 MG-MCG tablet Take 1 tablet by mouth daily. 1 Package 11  . potassium chloride SA (K-DUR) 20 MEQ tablet Take 1 tablet (20 mEq total) by mouth daily. (Patient taking differently: Take 20 mEq by mouth daily as needed. ) 30 tablet 3   No current facility-administered medications for this visit.    PHYSICAL EXAMINATION: ECOG PERFORMANCE STATUS: 1 - Symptomatic but completely ambulatory  Vitals:   09/27/19 1422  BP: (!) 155/76  Pulse: 91  Resp: 18  Temp: 97.8 F (36.6 C)  SpO2: 96%   Filed Weights   09/27/19 1422  Weight: (!) 302 lb 11.2 oz (137.3 kg)    LABORATORY DATA:  I have reviewed the data as listed CMP Latest Ref Rng & Units  05/16/2019 04/20/2019 05/06/2018  Glucose 70 - 99 mg/dL 87 80 100(H)  BUN 6 - 20 mg/dL 10 11 11   Creatinine 0.44 - 1.00 mg/dL 0.67 0.57 0.63  Sodium 135 - 145 mmol/L 138 138 140  Potassium 3.5 - 5.1 mmol/L 3.5 3.9 3.9  Chloride 98 - 111 mmol/L 104 106 107  CO2 22 - 32 mmol/L 25 25 29   Calcium 8.9 - 10.3 mg/dL 8.9 8.5 8.8  Total Protein 6.5 - 8.1 g/dL 7.8 7.1 7.0  Total Bilirubin 0.3 - 1.2 mg/dL 1.6(H) 2.0(H) 1.1  Alkaline Phos 38 - 126 U/L 74 78 65  AST 15 - 41 U/L 66(H) 52(H) 52(H)  ALT 0 - 44 U/L 37 27 32    Lab Results  Component Value Date   WBC 2.7 (L) 09/27/2019   HGB 13.3 09/27/2019   HCT 38.1 09/27/2019   MCV 90.1 09/27/2019   PLT 53 (L) 09/27/2019   NEUTROABS 1.6 (L) 09/27/2019    ASSESSMENT & PLAN:  Thrombocytopenia ([place]), followed by Hematology, s/p bone marrow biopsy 05/30/19 Leukopenia and thrombocytopenia Lab review:  05/30/2019: WBC 2.4, hemoglobin 14.1, platelets 43 04/21/2019: WBC 2.4, hemoglobin 14.4, platelets 44, ANC 1.5, ALC 0.6 05/06/2018: WBC 2.3, hemoglobin 13.9, platelets 53 05/19/2017: WBC 3.6, hemoglobin 14.2, platelets 54 ANA: Positive, homogeneous pattern 1: 80 Ultrasound abdomen: Enlarged spleen measuring 25 cm Folic acid and L24: Normal Hepatitis B surface antigen: Negative HIV: Negative HCV: Negative CMP: AST 66, total bilirubin 1.6: Most likely fatty liver WBC: 2.8, platelets 51 -----------------------------------------------------  Bone marrow biopsy 05/30/2019: Cellular bone marrow with trilineage hematopoiesis.  No evidence of dysplasia or leukemia or lymphoma.  Flow cytometry normal. Cytogenetics: Normal  Presumptive diagnosis: Autoimmune  Lab review 09/27/2019: WBC 2.7, hemoglobin 13.3, platelets 53, ANC 1.6 If she requires any surgical procedures with then we can support her with platelet transfusions plus or minus IVIG and steroids. Return to clinic in 4 months with labs and follow-up      No orders of the defined types were  placed in this encounter.  The patient has a good understanding of the overall plan. she agrees with it. she will call with any problems that may develop before the next visit here.  Total time spent: 20 mins including face to face time and time spent for planning, charting and coordination of care  Lauren Lose, MD 09/27/2019  I, Lauren Olsen, am acting as scribe for Dr. Nicholas Olsen.  I have reviewed the above documentation for accuracy and completeness, and I agree with the above.

## 2019-09-27 ENCOUNTER — Inpatient Hospital Stay (HOSPITAL_BASED_OUTPATIENT_CLINIC_OR_DEPARTMENT_OTHER): Payer: 59 | Admitting: Hematology and Oncology

## 2019-09-27 ENCOUNTER — Inpatient Hospital Stay: Payer: 59 | Attending: Hematology and Oncology

## 2019-09-27 ENCOUNTER — Other Ambulatory Visit: Payer: Self-pay

## 2019-09-27 DIAGNOSIS — D696 Thrombocytopenia, unspecified: Secondary | ICD-10-CM

## 2019-09-27 DIAGNOSIS — D72819 Decreased white blood cell count, unspecified: Secondary | ICD-10-CM | POA: Insufficient documentation

## 2019-09-27 DIAGNOSIS — Z79899 Other long term (current) drug therapy: Secondary | ICD-10-CM | POA: Diagnosis not present

## 2019-09-27 LAB — CBC WITH DIFFERENTIAL (CANCER CENTER ONLY)
Abs Immature Granulocytes: 0.01 10*3/uL (ref 0.00–0.07)
Basophils Absolute: 0 10*3/uL (ref 0.0–0.1)
Basophils Relative: 0 %
Eosinophils Absolute: 0.1 10*3/uL (ref 0.0–0.5)
Eosinophils Relative: 2 %
HCT: 38.1 % (ref 36.0–46.0)
Hemoglobin: 13.3 g/dL (ref 12.0–15.0)
Immature Granulocytes: 0 %
Lymphocytes Relative: 29 %
Lymphs Abs: 0.8 10*3/uL (ref 0.7–4.0)
MCH: 31.4 pg (ref 26.0–34.0)
MCHC: 34.9 g/dL (ref 30.0–36.0)
MCV: 90.1 fL (ref 80.0–100.0)
Monocytes Absolute: 0.3 10*3/uL (ref 0.1–1.0)
Monocytes Relative: 10 %
Neutro Abs: 1.6 10*3/uL — ABNORMAL LOW (ref 1.7–7.7)
Neutrophils Relative %: 59 %
Platelet Count: 53 10*3/uL — ABNORMAL LOW (ref 150–400)
RBC: 4.23 MIL/uL (ref 3.87–5.11)
RDW: 13.7 % (ref 11.5–15.5)
WBC Count: 2.7 10*3/uL — ABNORMAL LOW (ref 4.0–10.5)
nRBC: 0 % (ref 0.0–0.2)

## 2019-09-27 LAB — VITAMIN B12: Vitamin B-12: 1001 pg/mL — ABNORMAL HIGH (ref 180–914)

## 2019-09-27 LAB — FOLATE: Folate: 19.1 ng/mL (ref >5.9)

## 2019-09-27 NOTE — Assessment & Plan Note (Signed)
Leukopenia and thrombocytopenia Lab review:  05/30/2019: WBC 2.4, hemoglobin 14.1, platelets 43 04/21/2019: WBC 2.4, hemoglobin 14.4, platelets 44, ANC 1.5, ALC 0.6 05/06/2018: WBC 2.3, hemoglobin 13.9, platelets 53 05/19/2017: WBC 3.6, hemoglobin 14.2, platelets 54 ANA: Positive, homogeneous pattern 1: 80 Ultrasound abdomen: Enlarged spleen measuring 25 cm Folic acid and M16: Normal Hepatitis B surface antigen: Negative HIV: Negative HCV: Negative CMP: AST 66, total bilirubin 1.6: Most likely fatty liver WBC: 2.8, platelets 51 ----------------------------------------------------- Bone marrow biopsy 05/30/2019: Cellular bone marrow with trilineage hematopoiesis.  No evidence of dysplasia or leukemia or lymphoma.  Flow cytometry normal. Cytogenetics: Normal  Presumptive diagnosis: Autoimmune  Lab review 09/27/2019:  Return to clinic in 3 months with labs and follow-up

## 2019-10-26 ENCOUNTER — Encounter: Payer: Self-pay | Admitting: Hematology and Oncology

## 2020-01-10 ENCOUNTER — Telehealth: Payer: Self-pay | Admitting: Hematology and Oncology

## 2020-01-10 NOTE — Telephone Encounter (Signed)
Unable to reach pt. Left voicemail. Rescheduled appts on 6/18 to 6/24. Provider on pal.

## 2020-01-26 ENCOUNTER — Ambulatory Visit: Payer: 59 | Admitting: Hematology and Oncology

## 2020-01-26 ENCOUNTER — Other Ambulatory Visit: Payer: 59

## 2020-02-01 ENCOUNTER — Inpatient Hospital Stay: Payer: 59 | Admitting: Hematology and Oncology

## 2020-02-01 ENCOUNTER — Encounter: Payer: Self-pay | Admitting: Hematology and Oncology

## 2020-02-01 ENCOUNTER — Inpatient Hospital Stay: Payer: 59

## 2020-05-06 ENCOUNTER — Encounter: Payer: Self-pay | Admitting: Hematology and Oncology

## 2020-09-02 ENCOUNTER — Encounter: Payer: 59 | Admitting: Family Medicine

## 2021-08-18 IMAGING — US US ABDOMEN COMPLETE
1 series · 13 of 25 positions shown · non-contrast
Comparison: None.

CLINICAL DATA: Elevated LFTs. Evaluate for splenomegaly. Decrease
platelets.

EXAM:
ABDOMEN ULTRASOUND COMPLETE

[Series 1: us abdomen complete · 13 of 71 slices shown]
[im 1/71]
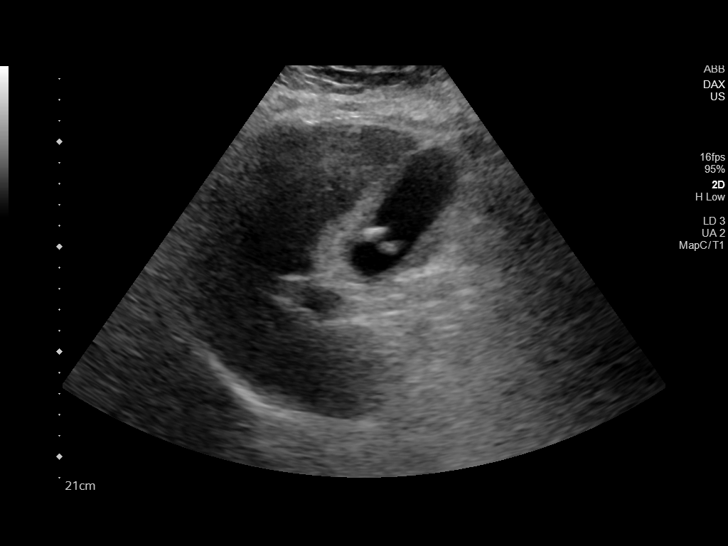
[im 6/71]
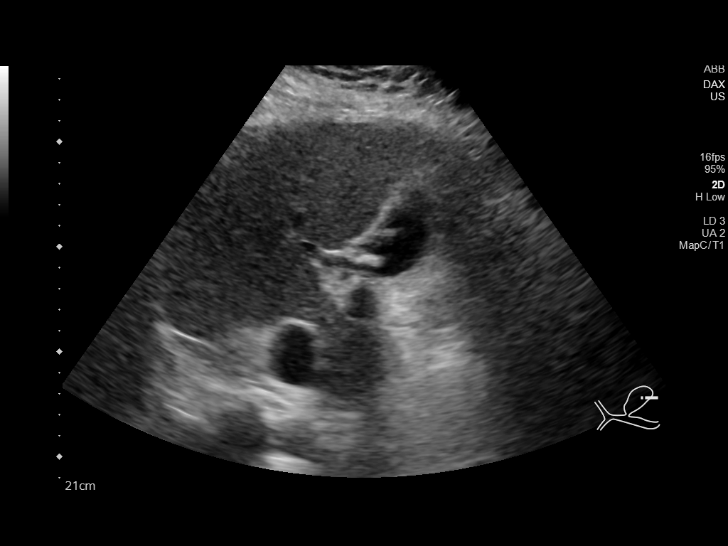
[im 12/71]
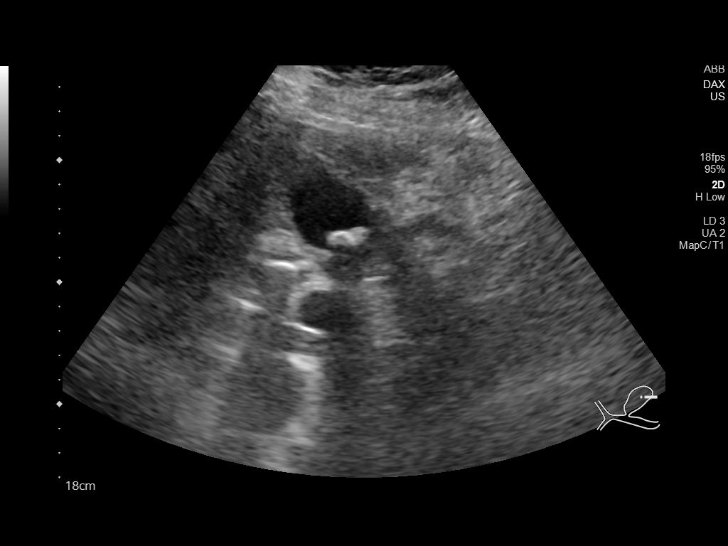
[im 18/71]
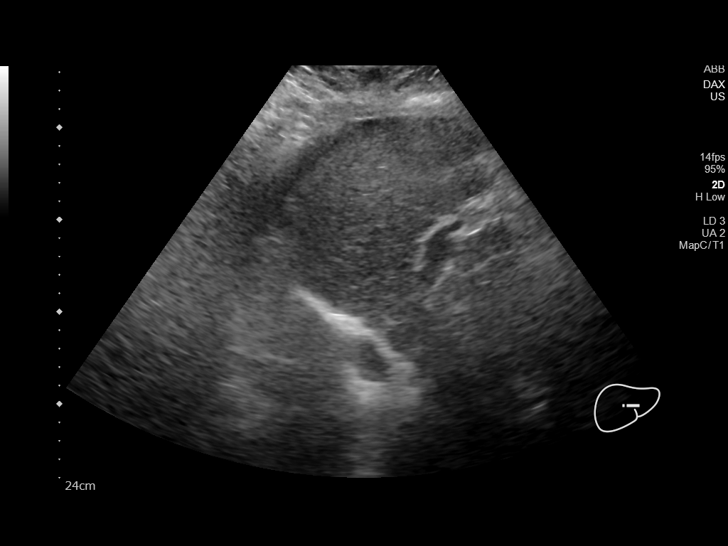
[im 24/71]
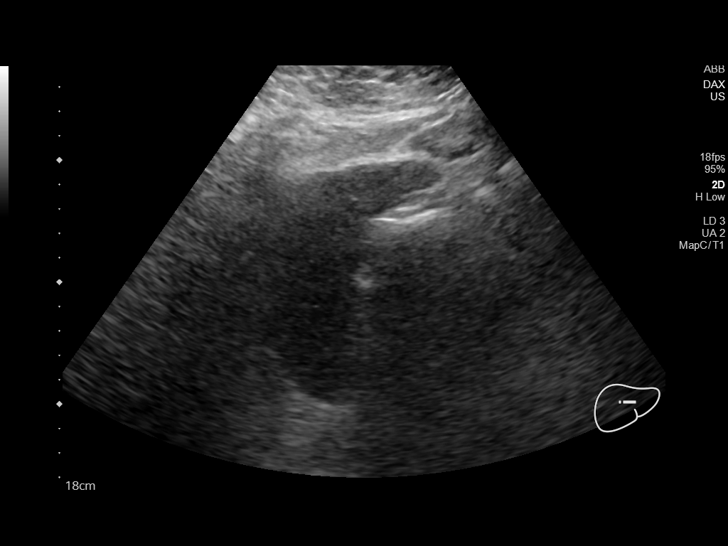
[im 30/71]
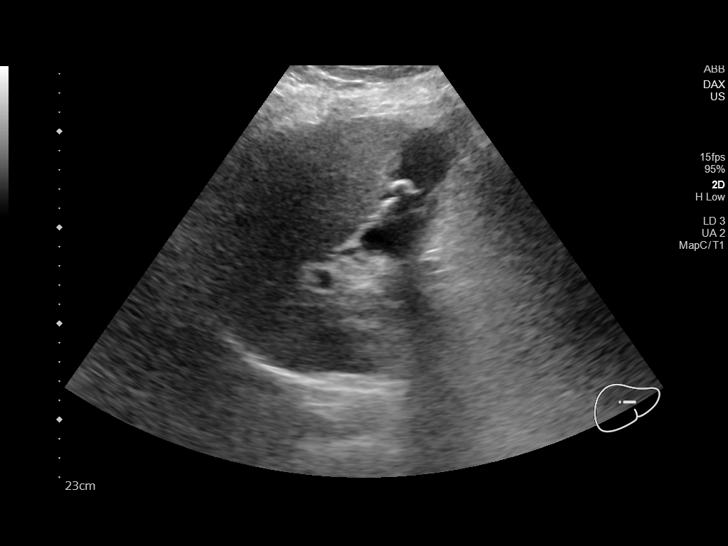
[im 36/71]
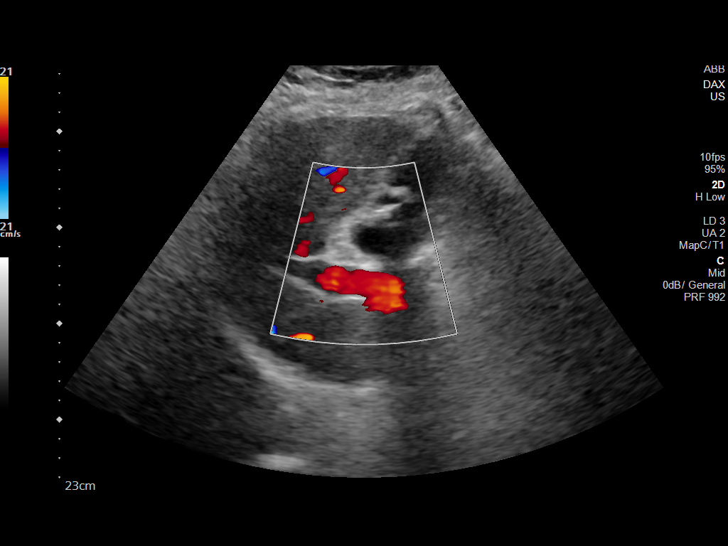
[im 41/71]
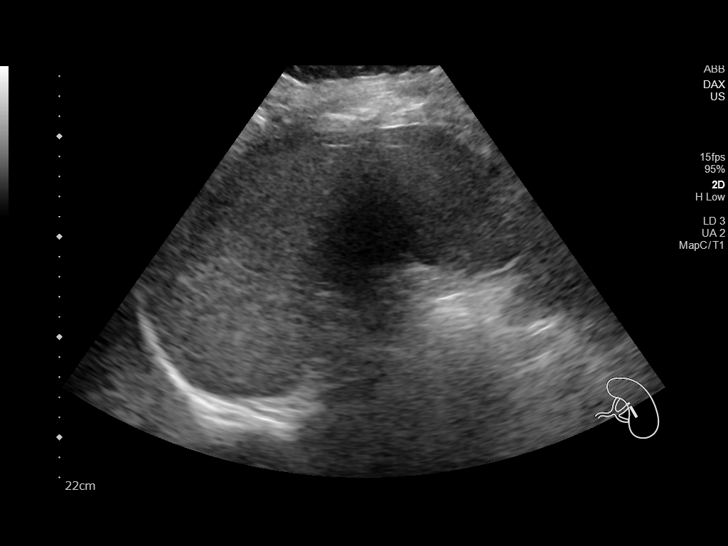
[im 47/71]
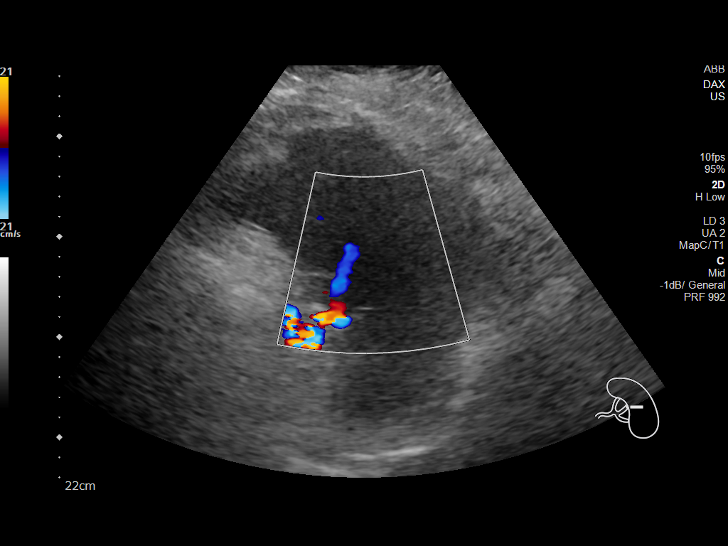
[im 53/71]
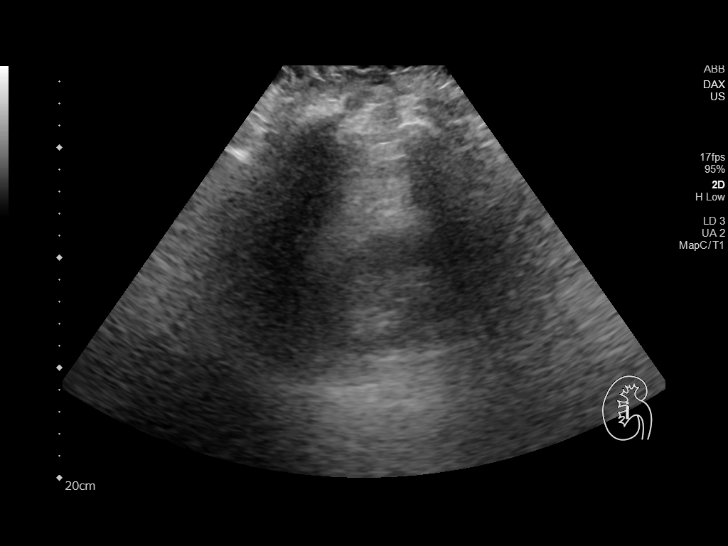
[im 59/71]
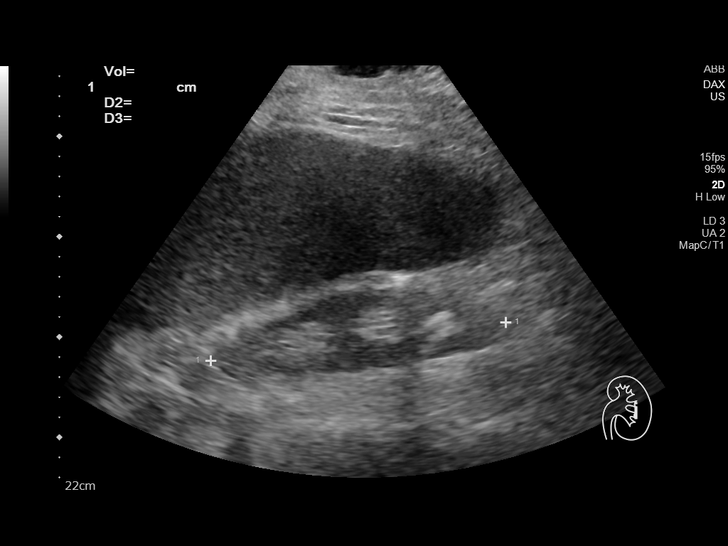
[im 65/71]
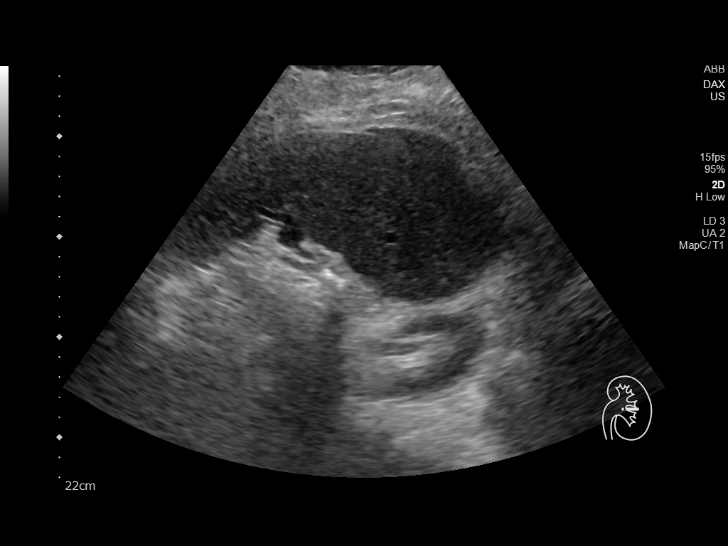
[im 71/71]
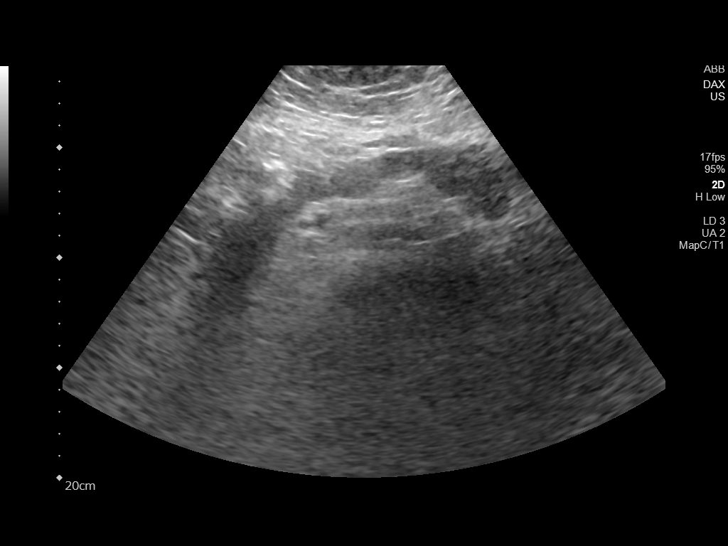

[13 of 25 positions shown; findings below may reference images not displayed]

FINDINGS: Gallbladder: Stones identified within the gallbladder which measure
up to 1.4 cm. There is gallbladder wall thickening with a maximum
thickness of 4.3 mm. Negative sonographic Murphy's sign. No
pericholecystic fluid or sludge.

Common bile duct: Diameter: 3.8 mm

Liver: No focal lesion identified. Within normal limits in
parenchymal echogenicity. Portal vein is patent on color Doppler
imaging with normal direction of blood flow towards the liver.

IVC: No abnormality visualized.

Pancreas: Visualized portion unremarkable.

Spleen: Marked splenomegaly. The spleen measures 25 cm in length
with a volume of 3450 cubic cm.

Right Kidney: Length: 9.6 cm. Echogenicity within normal limits. No
mass or hydronephrosis visualized.

Left Kidney: Length: 14.8 cm. Echogenicity within normal limits. No
mass or hydronephrosis visualized.

Abdominal aorta: No aneurysm visualized.

Other findings: None.
IMPRESSION: 1. Marked splenomegaly. The spleen measures 25 cm in length and has
a volume of 3450 cubic cm.
2. Gallstones and gallbladder wall thickening. Negative sonographic
Murphy's sign. Correlate for clinical signs or symptoms of acute
cholecystitis.
3. The right kidney is decreased in size compared with the left.

## 2021-08-25 IMAGING — CT CT BIOPSY AND ASPIRATION BONE MARROW
1 of 2 series · 15 of 28 positions shown, 19 images · non-contrast
Comparison: none

INDICATION: Leukopenia and thrombocytopenia of uncertain etiology. Please
perform CT-guided bone marrow biopsy for tissue diagnostic purposes.

[Series 2: i-spiral 5.0 b40f · axial · 0.95mm/px · z∈[-140,-74]mm · 15 of 23 slices shown, 19 images]
[im 2/23  mediastinal]
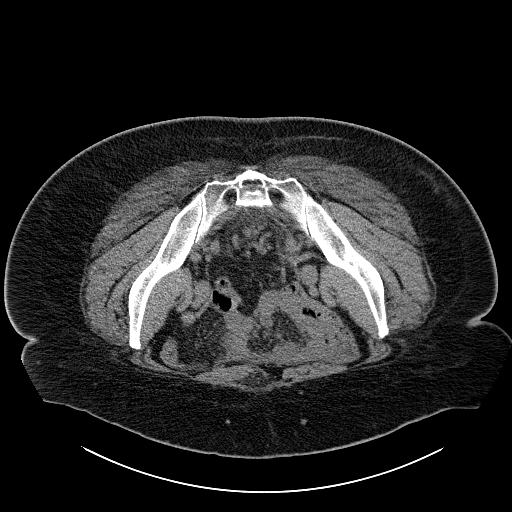
[im 2/23  lung]
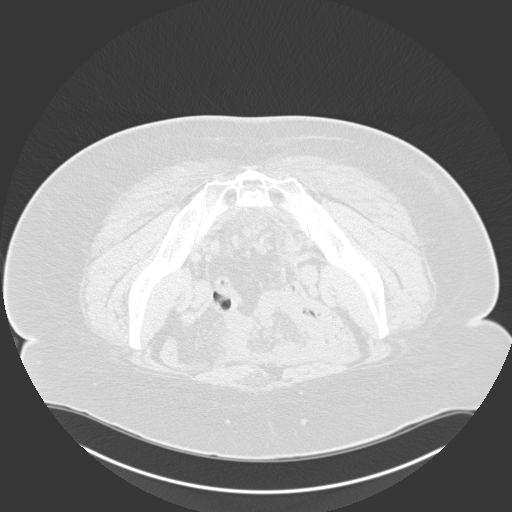
[im 4/23  lung]
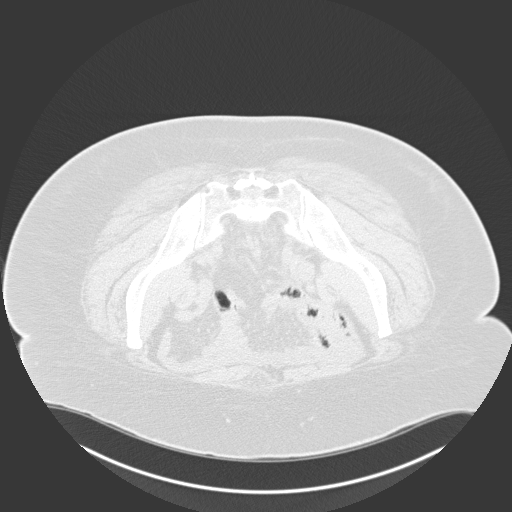
[im 5/23  lung]
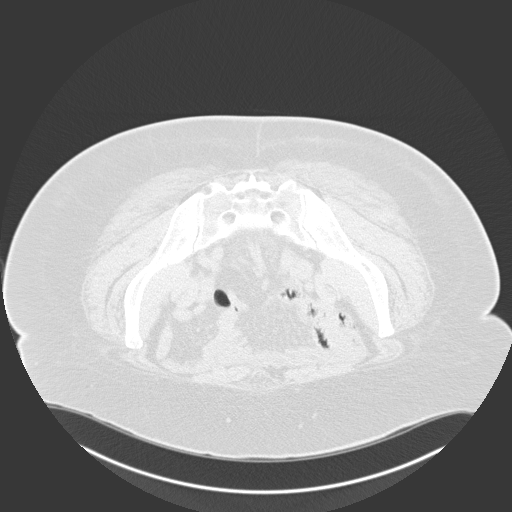
[im 6/23  lung]
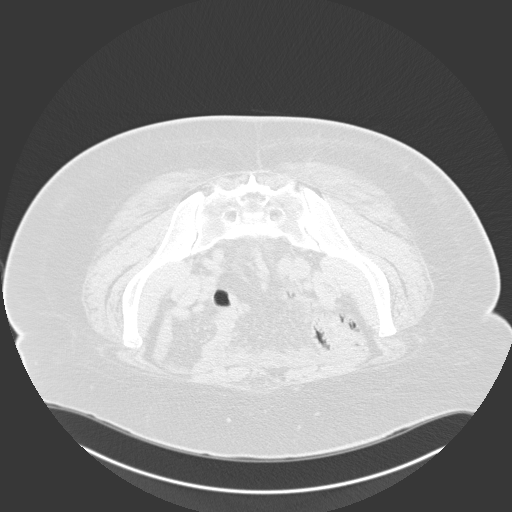
[im 8/23  mediastinal]
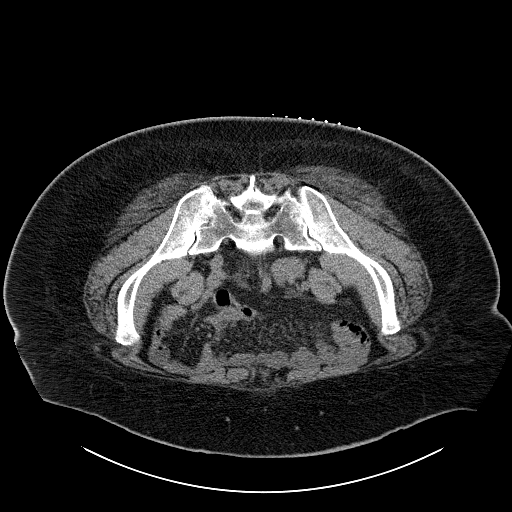
[im 8/23  lung]
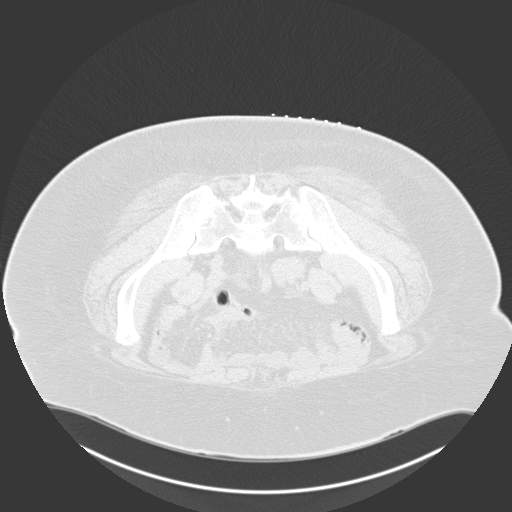
[im 9/23  lung]
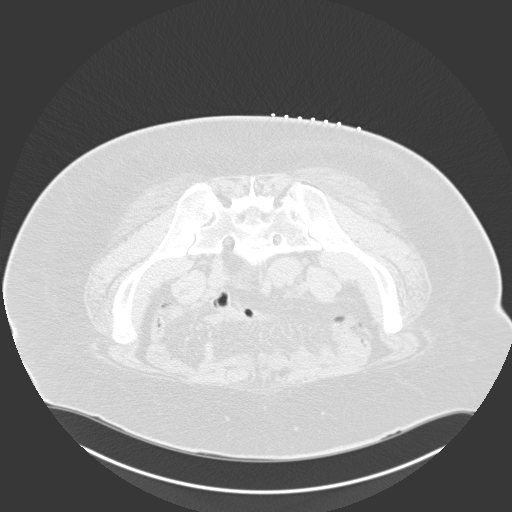
[im 10/23  lung]
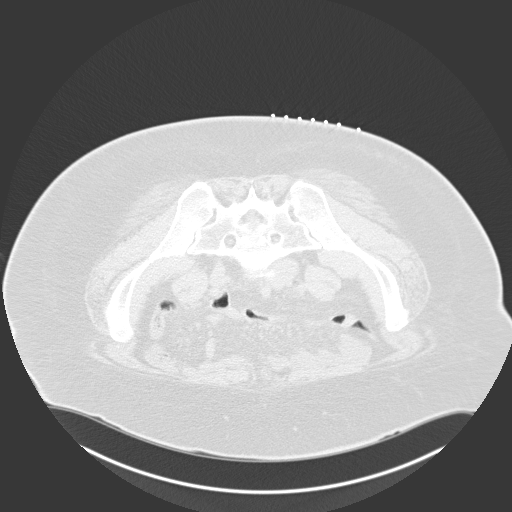
[im 12/23  lung]
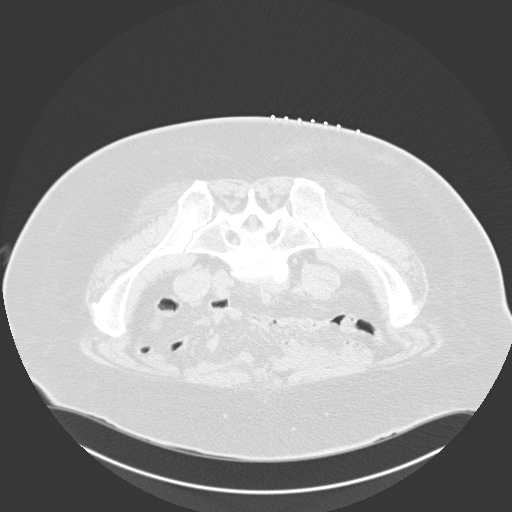
[im 13/23  mediastinal]
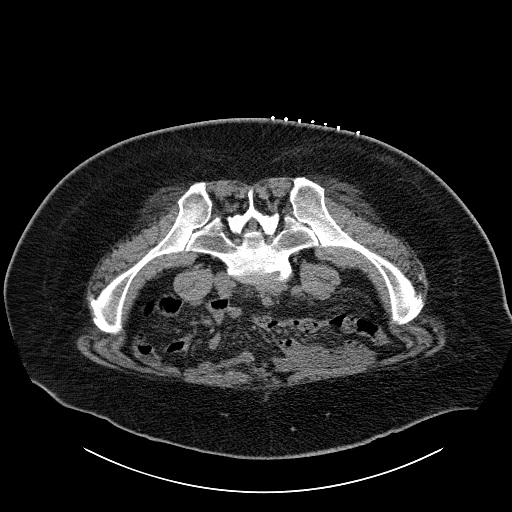
[im 13/23  lung]
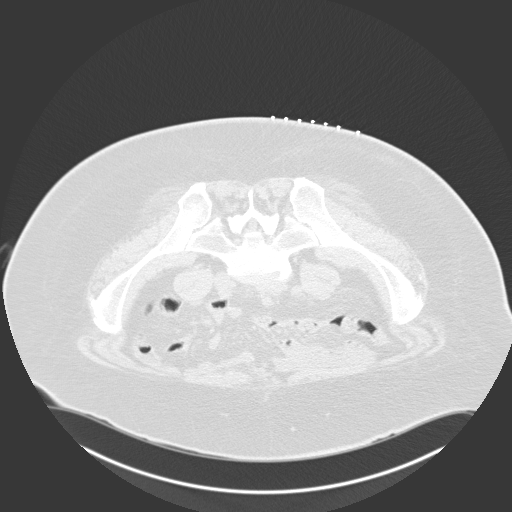
[im 14/23  lung]
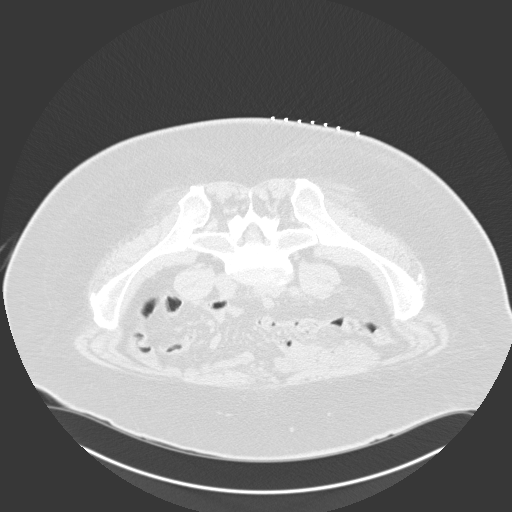
[im 16/23  lung]
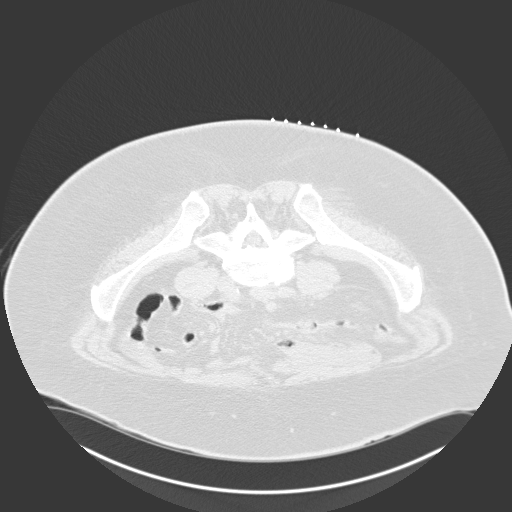
[im 17/23  lung]
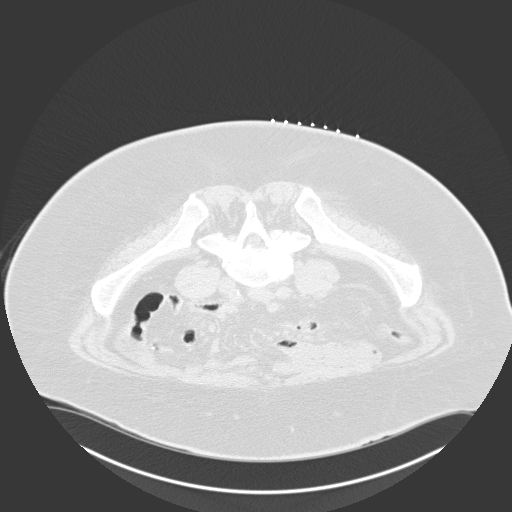
[im 18/23  mediastinal]
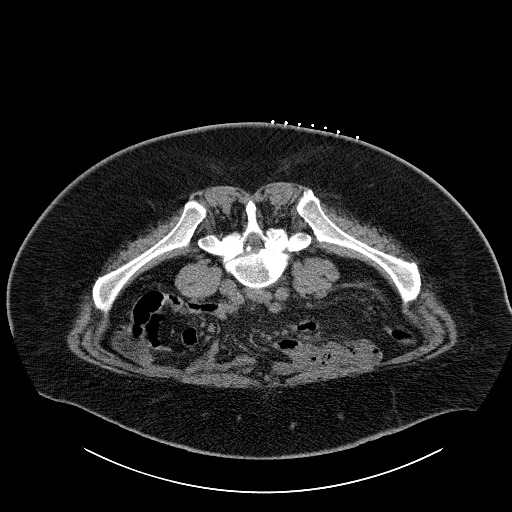
[im 18/23  lung]
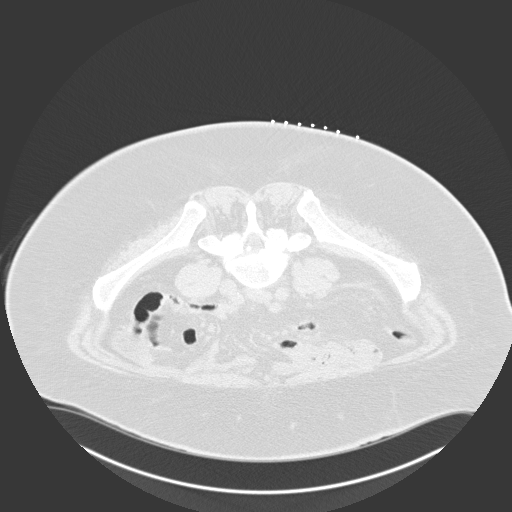
[im 20/23  lung]
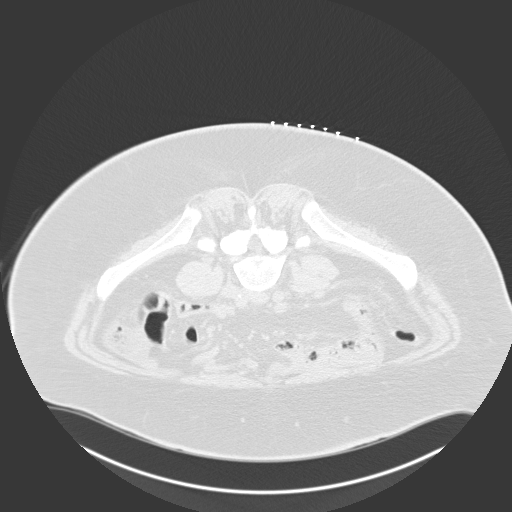
[im 21/23  lung]
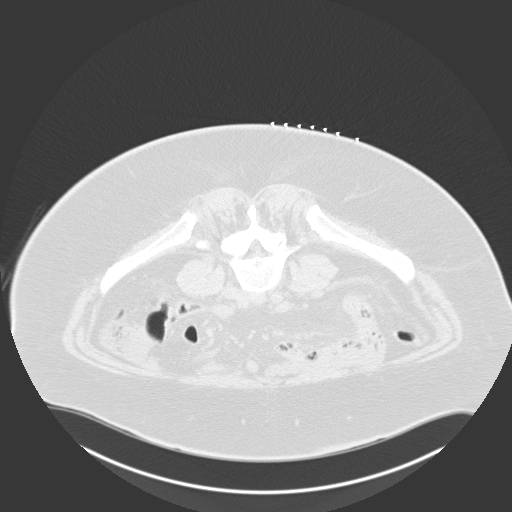

[15 of 28 positions shown; findings below may reference images not displayed]

EXAM:
CT-GUIDED BONE MARROW BIOPSY AND ASPIRATION

MEDICATIONS:
None

ANESTHESIA/SEDATION:
Fentanyl 100 mcg IV; Versed 3 mg IV

Sedation Time: 10 Minutes; The patient was continuously monitored
during the procedure by the interventional radiology nurse under my
direct supervision.

COMPLICATIONS:
None immediate.

PROCEDURE:
Informed consent was obtained from the patient following an
explanation of the procedure, risks, benefits and alternatives. The
patient understands, agrees and consents for the procedure. All
questions were addressed. A time out was performed prior to the
initiation of the procedure. The patient was positioned prone and
non-contrast localization CT was performed of the pelvis to
demonstrate the iliac marrow spaces. The operative site was prepped
and draped in the usual sterile fashion.

Under sterile conditions and local anesthesia, a 22 gauge spinal
needle was utilized for procedural planning. Next, an 11 gauge
coaxial bone biopsy needle was advanced into the left iliac marrow
space. Needle position was confirmed with CT imaging. Initially,
bone marrow aspiration was performed. Next, a bone marrow biopsy was
obtained with the 11 gauge outer bone marrow device. Samples were
prepared with the cytotechnologist and deemed adequate. The needle
was removed intact. Hemostasis was obtained with compression and a
dressing was placed. The patient tolerated the procedure well
without immediate post procedural complication.
IMPRESSION: Successful CT guided left iliac bone marrow aspiration and core
biopsy.
